# Patient Record
Sex: Male | Born: 1965 | Race: Black or African American | Hispanic: No | Marital: Single | Smoking: Current some day smoker
Health system: Southern US, Community
[De-identification: ages and names within clinical notes are randomized; demographics above are authoritative.]

## PROBLEM LIST (undated history)

## (undated) DIAGNOSIS — Z789 Other specified health status: Secondary | ICD-10-CM

## (undated) DIAGNOSIS — M1990 Unspecified osteoarthritis, unspecified site: Secondary | ICD-10-CM

## (undated) HISTORY — PX: EYE SURGERY: SHX253

---

## 2004-02-02 HISTORY — PX: LASIK: SHX215

## 2011-08-23 ENCOUNTER — Encounter (HOSPITAL_COMMUNITY): Payer: Self-pay | Admitting: *Deleted

## 2011-08-23 ENCOUNTER — Emergency Department (HOSPITAL_COMMUNITY)
Admission: EM | Admit: 2011-08-23 | Discharge: 2011-08-23 | Disposition: A | Discharge status: Home / Self Care | Payer: Medicaid Other | Attending: Emergency Medicine | Admitting: Emergency Medicine

## 2011-08-23 DIAGNOSIS — L02811 Cutaneous abscess of head [any part, except face]: Secondary | ICD-10-CM

## 2011-08-23 DIAGNOSIS — L02818 Cutaneous abscess of other sites: Secondary | ICD-10-CM | POA: Insufficient documentation

## 2011-08-23 MED ORDER — SULFAMETHOXAZOLE-TRIMETHOPRIM 800-160 MG PO TABS: 1.0000 | ORAL_TABLET | Freq: Two times a day (BID) | ORAL | Status: AC

## 2011-08-23 MED ORDER — HYDROCODONE-ACETAMINOPHEN 5-500 MG PO TABS: 1.0000 | ORAL_TABLET | Freq: Four times a day (QID) | ORAL | Status: AC | PRN

## 2011-08-23 NOTE — ED Provider Notes (Signed)
History   This chart was scribed for Geoffery Lyons, MD by Toya Smothers. The patient was seen in room TR08C/TR08C. Patient's care was started at 1422.  CSN: [id number]  Arrival date & time 08/23/11  1422   First MD Initiated Contact with Patient 08/23/11 1722      Chief Complaint  Patient presents with  . Abscess   The history is provided by the patient. No language interpreter was used.   Bruce Bates is a 46 y.o. male who presents to the Emergency Department complaining of sudden onset moderate abscess onset 4 days ago with associate swelling, redness, and white drainage. Pt reports that he believes that the abscess began as an insect bite, though he is unsure. Pt denies fever, chills, emesis, nausea, rash, and cough. Pt is a current everyday smoker, listing no pertinent medical history.   History reviewed. No pertinent past medical history.  History reviewed. No pertinent past surgical history.  History reviewed. No pertinent family history.  History  Substance Use Topics  . Smoking status: Current Some Day Smoker  . Smokeless tobacco: Not on file  . Alcohol Use: No   Review of Systems  Constitutional: Negative for fever and chills.  HENT: Negative for rhinorrhea and neck pain.   Eyes: Negative for pain.  Respiratory: Negative for cough and shortness of breath.   Cardiovascular: Negative for chest pain.  Gastrointestinal: Negative for nausea, vomiting, abdominal pain and diarrhea.  Genitourinary: Negative for dysuria.  Musculoskeletal: Negative for back pain.  Skin: Negative for rash.       Abscess  Neurological: Negative for dizziness and weakness.  All other systems reviewed and are negative.   Allergies  Review of patient's allergies indicates no known allergies.  Home Medications  No current outpatient prescriptions on file.  BP 117/81  Pulse 81  Temp 99 F (37.2 C) (Oral)  Resp 16  SpO2 100%  Physical Exam  Nursing note and vitals  reviewed. Constitutional: He is oriented to person, place, and time. He appears well-developed and well-nourished. No distress.  HENT:  Head: Normocephalic and atraumatic.  Eyes: EOM are normal. Pupils are equal, round, and reactive to light.  Neck: Neck supple. No tracheal deviation present.  Cardiovascular: Normal rate.   Pulmonary/Chest: Effort normal. No respiratory distress.  Abdominal: Soft. He exhibits no distension.  Musculoskeletal: Normal range of motion. He exhibits no edema.  Neurological: He is alert and oriented to person, place, and time. No sensory deficit.  Skin: Skin is warm and dry.       R scalp, there is a 2cm round fluctuant lesion that is draining purulent material. Tender to palpation w/ surrounding erythema.  Psychiatric: He has a normal mood and affect. His behavior is normal.   ED Course  Procedures (including critical care time) DIAGNOSTIC STUDIES: Oxygen Saturation is 100% on room air, normal by my interpretation.    COORDINATION OF CARE: 1726- Evaluated Pt. Pt is without distress. Discussed bactrim antibiotic.  Labs Reviewed - No data to display No results found.  No diagnosis found.  INCISION AND DRAINAGE Performed by: Geoffery Lyons Consent: Verbal consent obtained. Risks and benefits: risks, benefits and alternatives were discussed Type: abscess  Body area: scalp  Anesthesia: local infiltration  Local anesthetic: lidocaine 2% with epinephrine  Anesthetic total: 1 ml  Complexity: complex Blunt dissection to break up loculations  Drainage: purulent  Drainage amount: mild  Packing material: 1/4 in iodoform gauze  Patient tolerance: Patient tolerated the procedure well with no  immediate complications.     MDM  The patient has a scalp abscess that was already draining when he presented.   I was able to express pus just with squeezing it.  I did open it there rest of the way but did not pack it.  He will be discharged to home with  bactrim, pain meds.  To return prn if worsens.      I personally performed the services described in this documentation, which was scribed in my presence. The recorded information has been reviewed and considered.      Geoffery Lyons, MD 08/24/11 [phone]

## 2011-08-23 NOTE — ED Notes (Signed)
Pt reports abscess with drainage above right temporal area. Swollen area noted, bandaid in place.

## 2011-11-03 ENCOUNTER — Emergency Department (HOSPITAL_COMMUNITY)
Admission: EM | Admit: 2011-11-03 | Discharge: 2011-11-03 | Disposition: A | Discharge status: Home / Self Care | Payer: Medicaid Other | Attending: Emergency Medicine | Admitting: Emergency Medicine

## 2011-11-03 ENCOUNTER — Encounter (HOSPITAL_COMMUNITY): Payer: Self-pay | Admitting: Neurology

## 2011-11-03 DIAGNOSIS — IMO0002 Reserved for concepts with insufficient information to code with codable children: Secondary | ICD-10-CM | POA: Insufficient documentation

## 2011-11-03 DIAGNOSIS — L0291 Cutaneous abscess, unspecified: Secondary | ICD-10-CM

## 2011-11-03 DIAGNOSIS — F17200 Nicotine dependence, unspecified, uncomplicated: Secondary | ICD-10-CM | POA: Insufficient documentation

## 2011-11-03 MED ORDER — TRAMADOL HCL 50 MG PO TABS: 50.0000 mg | ORAL_TABLET | Freq: Four times a day (QID) | ORAL | Status: DC | PRN

## 2011-11-03 MED ORDER — SULFAMETHOXAZOLE-TRIMETHOPRIM 800-160 MG PO TABS: 2.0000 | ORAL_TABLET | Freq: Two times a day (BID) | ORAL | Status: DC

## 2011-11-03 NOTE — ED Notes (Signed)
ED PA at bedside to I&D abscess.

## 2011-11-03 NOTE — ED Notes (Signed)
Pt has abscess to left axilla. No drainage noted. Pt reporting hx of such. 9/10 pain. Pt a x 4. NAD

## 2011-11-03 NOTE — ED Provider Notes (Signed)
History     CSN: [id number]  Arrival date & time 11/03/11  1023   First MD Initiated Contact with Patient 11/03/11 1127      Chief Complaint  Patient presents with  . Abscess    (Consider location/radiation/quality/duration/timing/severity/associated sxs/prior treatment) HPI  46 y.o. -year-old male in no acute distress complaining of abscess to left axilla worsening over the course of 72 hours. Patient denies any fever, nausea vomiting. Pain is 9/10 and exacerbated by movement. Patient had similar prior episode.  History reviewed. No pertinent past medical history.  History reviewed. No pertinent past surgical history.  No family history on file.  History  Substance Use Topics  . Smoking status: Current Some Day Smoker  . Smokeless tobacco: Not on file  . Alcohol Use: No      Review of Systems  Constitutional: Negative for fever.  Respiratory: Negative for shortness of breath.   Cardiovascular: Negative for chest pain.  Gastrointestinal: Negative for nausea, vomiting, abdominal pain and diarrhea.  Skin: Positive for rash.  All other systems reviewed and are negative.    Allergies  Review of patient's allergies indicates no known allergies.  Home Medications  No current outpatient prescriptions on file.  BP 109/69  Pulse 63  Temp 97.3 F (36.3 C) (Oral)  Resp 22  SpO2 100%  Physical Exam  Nursing note and vitals reviewed. Constitutional: He is oriented to person, place, and time. He appears well-developed and well-nourished. No distress.  HENT:  Head: Normocephalic.  Eyes: Conjunctivae normal and EOM are normal.  Cardiovascular: Normal rate.   Pulmonary/Chest: Effort normal. No stridor.  Abdominal: Soft.  Musculoskeletal: Normal range of motion.  Neurological: He is alert and oriented to person, place, and time.  Skin:       2 cm fluctuant abscess to left axilla surrounded by approximately 1 cm of indurated skin. No active drainage significant  tenderness to palpation.  Psychiatric: He has a normal mood and affect.    ED Course  Procedures (including critical care time)  INCISION AND DRAINAGE Performed by: Wynetta Emery Consent: Verbal consent obtained. Risks and benefits: risks, benefits and alternatives were discussed Type: abscess  Body area: Left axilla  Anesthesia: local infiltration  Local anesthetic: lidocaine 2% without epinephrine  Anesthetic total: 6 ml  Complexity: complex Blunt dissection to break up loculations  Drainage: purulent  Drainage amount: 3   Packing material: 1/4 in iodoform gauze  Patient tolerance: Patient tolerated the procedure well with no immediate complications.    Labs Reviewed - No data to display No results found.   1. Abscess and cellulitis       MDM  Abscess left axilla status post I&D wound culture sent, which patient instructed to return in 48 hours for wound check. Patient will be started on Bactrim and tramadol for pain control.    New Prescriptions   SULFAMETHOXAZOLE-TRIMETHOPRIM (BACTRIM DS) 800-160 MG PER TABLET    Take 2 tablets by mouth 2 (two) times daily.   TRAMADOL (ULTRAM) 50 MG TABLET    Take 1 tablet (50 mg total) by mouth every 6 (six) hours as needed for pain.          Wynetta Emery, PA-C 11/03/11 1256

## 2011-11-03 NOTE — ED Provider Notes (Signed)
Abscess L axilla.  Move to CDU for I&D  Nelia Shi, MD 11/03/11 1128

## 2011-11-03 NOTE — ED Provider Notes (Signed)
Medical screening examination/treatment/procedure(s) were performed by non-physician practitioner and as supervising physician I was immediately available for consultation/collaboration.    Leng Montesdeoca L Herbert Aguinaldo, MD 11/03/11 2232 

## 2011-11-03 NOTE — ED Notes (Signed)
Pt moved to CDU per EDP.

## 2011-11-06 LAB — CULTURE, ROUTINE-ABSCESS

## 2011-11-06 NOTE — ED Notes (Signed)
Lab called + MRSA abscess culture. Treated with Bactrim, sensitive to same per protocol MD. Will contact patient with positive result.

## 2011-11-07 ENCOUNTER — Telehealth (HOSPITAL_COMMUNITY): Payer: Self-pay | Admitting: Emergency Medicine

## 2012-05-08 ENCOUNTER — Emergency Department (HOSPITAL_COMMUNITY)
Admission: EM | Admit: 2012-05-08 | Discharge: 2012-05-08 | Disposition: A | Discharge status: Home / Self Care | Payer: Medicaid Other | Source: Home / Self Care | Attending: Family Medicine | Admitting: Family Medicine

## 2012-05-08 ENCOUNTER — Encounter (HOSPITAL_COMMUNITY): Payer: Self-pay | Admitting: *Deleted

## 2012-05-08 DIAGNOSIS — T1590XA Foreign body on external eye, part unspecified, unspecified eye, initial encounter: Secondary | ICD-10-CM

## 2012-05-08 DIAGNOSIS — T1591XA Foreign body on external eye, part unspecified, right eye, initial encounter: Secondary | ICD-10-CM

## 2012-05-08 MED ORDER — TOBRAMYCIN 0.3 % OP OINT
TOPICAL_OINTMENT | Freq: Three times a day (TID) | OPHTHALMIC | Status: DC
  Administered 2012-05-08: 20:00:00 via OPHTHALMIC

## 2012-05-08 MED ORDER — TOBRAMYCIN 0.3 % OP OINT
TOPICAL_OINTMENT | OPHTHALMIC | Status: AC
  Filled 2012-05-08: qty 3.5

## 2012-05-08 NOTE — ED Notes (Signed)
C/o R eye pain onset yesterday  He thinks he got something in his eye. He has tried flushing it with water.  No contact use.

## 2012-05-08 NOTE — ED Provider Notes (Signed)
History     CSN: [id number]  Arrival date & time 05/08/12  1478   First MD Initiated Contact with Patient 05/08/12 1824      Chief Complaint  Patient presents with  . Eye Pain    (Consider location/radiation/quality/duration/timing/severity/associated sxs/prior treatment) Patient is a 47 y.o. male presenting with eye pain. The history is provided by the patient.  Eye Pain This is a new problem. The current episode started yesterday. The problem occurs constantly. The problem has not changed since onset.Exacerbated by: blinking.    History reviewed. No pertinent past medical history.  Past Surgical History  Procedure Laterality Date  . Lasik  '06    Family History  Problem Relation Age of Onset  . Cancer Mother     History  Substance Use Topics  . Smoking status: Current Some Day Smoker -- 0.50 packs/day    Types: Cigarettes  . Smokeless tobacco: Not on file  . Alcohol Use: No      Review of Systems  Constitutional: Negative.   HENT: Negative.   Eyes: Positive for pain. Negative for photophobia, discharge, redness, itching and visual disturbance.    Allergies  Review of patient's allergies indicates no known allergies.  Home Medications   Current Outpatient Rx  Name  Route  Sig  Dispense  Refill  . sulfamethoxazole-trimethoprim (BACTRIM DS) 800-160 MG per tablet   Oral   Take 2 tablets by mouth 2 (two) times daily.   28 tablet   0   . traMADol (ULTRAM) 50 MG tablet   Oral   Take 1 tablet (50 mg total) by mouth every 6 (six) hours as needed for pain.   15 tablet   0     BP 128/79  Pulse 60  Temp(Src) 98.4 F (36.9 C) (Oral)  Resp 18  SpO2 99%  Physical Exam  Nursing note and vitals reviewed. Constitutional: He appears well-developed and well-nourished.  HENT:  Head: Normocephalic.  Right Ear: External ear normal.  Left Ear: External ear normal.  Mouth/Throat: Oropharynx is clear and moist.  Eyes: Conjunctivae and EOM are normal. Pupils  are equal, round, and reactive to light. Right eye exhibits no chemosis, no discharge, no exudate and no hordeolum. Foreign body present in the right eye. Left eye exhibits no discharge.      ED Course  Procedures (including critical care time)  Labs Reviewed - No data to display No results found.   1. Foreign body, eye, right, initial encounter       MDM          Linna Hoff, MD 05/08/12 [phone]

## 2012-06-01 ENCOUNTER — Encounter (HOSPITAL_COMMUNITY): Payer: Self-pay | Admitting: *Deleted

## 2012-06-01 ENCOUNTER — Emergency Department (HOSPITAL_COMMUNITY): Payer: Medicaid Other

## 2012-06-01 ENCOUNTER — Emergency Department (HOSPITAL_COMMUNITY)
Admission: EM | Admit: 2012-06-01 | Discharge: 2012-06-01 | Disposition: A | Discharge status: Home / Self Care | Payer: Medicaid Other | Attending: Emergency Medicine | Admitting: Emergency Medicine

## 2012-06-01 DIAGNOSIS — M19011 Primary osteoarthritis, right shoulder: Secondary | ICD-10-CM

## 2012-06-01 DIAGNOSIS — F17200 Nicotine dependence, unspecified, uncomplicated: Secondary | ICD-10-CM | POA: Insufficient documentation

## 2012-06-01 DIAGNOSIS — M25519 Pain in unspecified shoulder: Secondary | ICD-10-CM | POA: Insufficient documentation

## 2012-06-01 DIAGNOSIS — M19019 Primary osteoarthritis, unspecified shoulder: Secondary | ICD-10-CM | POA: Insufficient documentation

## 2012-06-01 DIAGNOSIS — M25511 Pain in right shoulder: Secondary | ICD-10-CM

## 2012-06-01 MED ORDER — CYCLOBENZAPRINE HCL 10 MG PO TABS: 10.0000 mg | ORAL_TABLET | Freq: Two times a day (BID) | ORAL | Status: DC | PRN

## 2012-06-01 MED ORDER — HYDROCODONE-ACETAMINOPHEN 5-325 MG PO TABS
1.0000 | ORAL_TABLET | Freq: Once | ORAL | Status: AC
  Administered 2012-06-01: 1 via ORAL
  Filled 2012-06-01: qty 1

## 2012-06-01 MED ORDER — NAPROXEN 500 MG PO TABS: 500.0000 mg | ORAL_TABLET | Freq: Two times a day (BID) | ORAL | Status: DC

## 2012-06-01 NOTE — ED Notes (Signed)
Pt reports right shoulder pain since April 19th. C/o swelling and pain not relieved by ice packs and taking tylenol. Denies injury

## 2012-06-01 NOTE — ED Notes (Signed)
Patient transported to X-ray 

## 2012-06-01 NOTE — ED Provider Notes (Signed)
History  This chart was scribed for non-physician practitioner Dierdre Forth, PA-C working with Glynn Octave, MD, by Candelaria Stagers, ED Scribe. This patient was seen in room WTR6/WTR6 and the patient's care was started at 8:10 PM   CSN: [id number]  Arrival date & time 06/01/12  2001   First MD Initiated Contact with Patient 06/01/12 2009      Chief Complaint  Patient presents with  . Shoulder Pain     The history is provided by the patient. No language interpreter was used.   Lonzo Saulter is a 47 y.o. male who presents to the Emergency Department complaining of constant right shoulder pain that started about one month ago with no changes.  He denies injury or trauma.  Pt reports the pain is worse at night.  He has taken Motrin with no relief. He states she has also tried ice packs and tylenol without relief.   Nothing makes it particularly better or worse.  The pain is described as aching, rated at an 8/10, pinpoint over the [facility] joint and non-radiating.  Pt is without Hx of trauma in the distant past.    History reviewed. No pertinent past medical history.  Past Surgical History  Procedure Laterality Date  . Lasik  '06    Family History  Problem Relation Age of Onset  . Cancer Mother     History  Substance Use Topics  . Smoking status: Current Some Day Smoker -- 0.50 packs/day    Types: Cigarettes  . Smokeless tobacco: Not on file  . Alcohol Use: No      Review of Systems  Musculoskeletal: Positive for arthralgias (right shoulder pain ).  All other systems reviewed and are negative.    Allergies  Review of patient's allergies indicates no known allergies.  Home Medications   Current Outpatient Rx  Name  Route  Sig  Dispense  Refill  . acetaminophen (TYLENOL) 325 MG tablet   Oral   Take 650 mg by mouth every 6 (six) hours as needed for pain.         Marland Kitchen ibuprofen (ADVIL,MOTRIN) 200 MG tablet   Oral   Take 200 mg by mouth every 6 (six) hours as  needed for pain.         . cyclobenzaprine (FLEXERIL) 10 MG tablet   Oral   Take 1 tablet (10 mg total) by mouth 2 (two) times daily as needed for muscle spasms.   20 tablet   0   . naproxen (NAPROSYN) 500 MG tablet   Oral   Take 1 tablet (500 mg total) by mouth 2 (two) times daily with a meal.   30 tablet   0     BP 119/75  Pulse 91  Temp(Src) 99 F (37.2 C) (Oral)  SpO2 99%  Physical Exam  Nursing note and vitals reviewed. Constitutional: He appears well-developed and well-nourished. No distress.  HENT:  Head: Normocephalic and atraumatic.  Eyes: Conjunctivae are normal.  Neck: Normal range of motion and full passive range of motion without pain. No spinous process tenderness and no muscular tenderness present. No rigidity. Normal range of motion present.  Cardiovascular: Normal rate, regular rhythm, normal heart sounds and intact distal pulses.   No murmur heard. Capillary refill less than 3 seconds.   Pulmonary/Chest: Effort normal and breath sounds normal. No respiratory distress. He has no wheezes.  Musculoskeletal: Normal range of motion. He exhibits tenderness. He exhibits no edema.       Right shoulder:  He exhibits tenderness and bony tenderness. He exhibits normal range of motion, no swelling, no effusion, no crepitus, no deformity, no laceration, no pain, no spasm, normal pulse and normal strength.  Full ROM of right shoulder.  No joint line tenderness. Mild tenderness to palpation of right trapezius. Mild pinpoint pain over the [facility] joint Negative empty can test and apprehension test  Lymphadenopathy:    He has no cervical adenopathy.  Neurological: He is alert. He exhibits normal muscle tone. Coordination normal.  Sensation intact.  Strength intact.    Skin: Skin is warm and dry. No rash noted. He is not diaphoretic. No erythema.  No tenting of the skin  Psychiatric: He has a normal mood and affect.    ED Course  Procedures   DIAGNOSTIC STUDIES: Oxygen  Saturation is 99% on room air, normal by my interpretation.    COORDINATION OF CARE:  8:10 PM Discussed course of care with pt which includes right shoulder xray, antiinflammatory, and muscle relaxer.  Advised pt to follow up with orthopaedist.  Pt understands and agrees.    Labs Reviewed - No data to display Dg Shoulder Right  06/01/2012  *RADIOLOGY REPORT*  Clinical Data: Right shoulder pain since 04/19. No Known injury.  RIGHT SHOULDER - 2+ VIEW  Comparison:  None.  Findings:  There is no evidence of fracture or dislocation. Mild degenerative change AC joint without separation.  No osseous destructive lesion.  Adjacent ribs intact.  IMPRESSION: No evidence for fracture or dislocation.  Mild DJD AC joint.   Original Report Authenticated By: Davonna Belling, M.D.      1. Arthralgia of right acromioclavicular joint   2. Arthritis of right acromioclavicular joint       MDM  Oletta Darter presents with ongoing nontraumatic L shoulder pain >31month.  Patient X-Ray negative for obvious fracture or dislocation. I personally reviewed the imaging tests through PACS system.  I reviewed available ER/hospitalization records through the EMR.  Pain managed in ED. Pt advised to follow up with orthopedics if symptoms persist for possibility of missed fracture diagnosis. Patient given sling while in ED, conservative therapy recommended and discussed. Patient will be dc home & is agreeable with above plan.  I have also discussed reasons to return immediately to the ER.  Patient expresses understanding and agrees with plan.   I personally performed the services described in this documentation, which was scribed in my presence. The recorded information has been reviewed and is accurate.   Dahlia Client Carlita Whitcomb, PA-C 06/02/12 [phone]

## 2012-06-02 NOTE — ED Provider Notes (Signed)
Medical screening examination/treatment/procedure(s) were performed by non-physician practitioner and as supervising physician I was immediately available for consultation/collaboration.  Glynn Octave, MD 06/02/12 [phone]

## 2013-11-18 ENCOUNTER — Encounter (HOSPITAL_COMMUNITY): Payer: Self-pay | Admitting: Emergency Medicine

## 2013-11-18 ENCOUNTER — Emergency Department (HOSPITAL_COMMUNITY)
Admission: EM | Admit: 2013-11-18 | Discharge: 2013-11-18 | Disposition: A | Discharge status: Home / Self Care | Payer: Medicaid Other | Attending: Emergency Medicine | Admitting: Emergency Medicine

## 2013-11-18 DIAGNOSIS — M545 Low back pain, unspecified: Secondary | ICD-10-CM

## 2013-11-18 DIAGNOSIS — Z720 Tobacco use: Secondary | ICD-10-CM | POA: Insufficient documentation

## 2013-11-18 DIAGNOSIS — M549 Dorsalgia, unspecified: Secondary | ICD-10-CM | POA: Diagnosis present

## 2013-11-18 DIAGNOSIS — M791 Myalgia: Secondary | ICD-10-CM | POA: Insufficient documentation

## 2013-11-18 MED ORDER — NAPROXEN 500 MG PO TABS: 500.0000 mg | ORAL_TABLET | Freq: Two times a day (BID) | ORAL | Status: DC

## 2013-11-18 MED ORDER — METHOCARBAMOL 500 MG PO TABS: 500.0000 mg | ORAL_TABLET | Freq: Two times a day (BID) | ORAL | Status: DC | PRN

## 2013-11-18 NOTE — ED Notes (Signed)
Pt reports lower back pain since last Tuesday, pt was cutting grass with self Systems developerpropeller lawn mower. Denies trauma or injury. Pain 10/10.

## 2013-11-18 NOTE — ED Notes (Signed)
Pt ambulatory upon dc with ALL belongings they arrived with.

## 2013-11-18 NOTE — ED Provider Notes (Signed)
CSN: [id number]     Arrival date & time 11/18/13  1503 History   None    Chief Complaint  Patient presents with  . Back Pain   Patient is a 48 y.o. male presenting with back pain. The history is provided by the patient. No language interpreter was used.  Back Pain  This chart was scribed for non-physician practitioner, Mayme GentaBen Jaquise Faux PA-C working with Gilda Creasehristopher J. Pollina, *, by Bruce Bates, ED Scribe. This patient was seen in room WTR8/WTR8 and the patient's care was started at 4:08 PM.  Bruce Bates is a 48 y.o. male who presents to the Emergency Department complaining of left lower back pain that began 5 days ago. Pt states he was laying around the house when back pain began. He reports hx injuring back in 2006 and has had intermittent back pain since. Pt described pain as a tightness with standing. Pt has tried tylenol without relief to pain. Pt has not been able to sleep due to the pain. He reports unsteady gait. Pt denies new or recent injury. Pt denies hx back pain red flags. Pt denies bladder and bowel incontinence. He denies PCP and orthopedist.  History reviewed. No pertinent past medical history. Past Surgical History  Procedure Laterality Date  . Lasik  '06   Family History  Problem Relation Age of Onset  . Cancer Mother    History  Substance Use Topics  . Smoking status: Current Some Day Smoker -- 0.50 packs/day for 25 years    Types: Cigarettes  . Smokeless tobacco: Not on file  . Alcohol Use: No   Review of Systems  Musculoskeletal: Positive for back pain, gait problem and myalgias.  All other systems reviewed and are negative.  Allergies  Review of patient's allergies indicates no known allergies.  Home Medications   Prior to Admission medications   Medication Sig Start Date End Date Taking? Authorizing Provider  acetaminophen (TYLENOL) 325 MG tablet Take 650 mg by mouth every 6 (six) hours as needed for mild pain or moderate pain (pain).    Yes Historical  Provider, MD  methocarbamol (ROBAXIN) 500 MG tablet Take 1 tablet (500 mg total) by mouth 2 (two) times daily as needed for muscle spasms. 11/18/13   Earle GellBenjamin W Jaquelin Meaney, PA-C  naproxen (NAPROSYN) 500 MG tablet Take 1 tablet (500 mg total) by mouth 2 (two) times daily. 11/18/13   Earle GellBenjamin W Einar Nolasco, PA-C   BP 106/82  Pulse 61  Temp(Src) 99 F (37.2 C) (Oral)  Resp 16  SpO2 100% Physical Exam  Nursing note and vitals reviewed. Constitutional: He is oriented to person, place, and time. He appears well-developed and well-nourished. No distress.  Awake, alert, nontoxic appearance with baseline speech.  HENT:  Head: Normocephalic and atraumatic.  Eyes: Conjunctivae and EOM are normal. Pupils are equal, round, and reactive to light. Right eye exhibits no discharge. Left eye exhibits no discharge.  Neck: Neck supple.  Cardiovascular: Normal rate and regular rhythm.   No murmur heard. Pulmonary/Chest: Effort normal and breath sounds normal. No respiratory distress. He has no wheezes. He has no rales. He exhibits no tenderness.  Abdominal: Soft. Bowel sounds are normal. He exhibits no mass. There is no tenderness. There is no rebound.  Musculoskeletal: Normal range of motion.  Diffuse paravertebral lumbar muscle tenderness. No midline bony tenderness. No overt erythema, rashes or other deformities appreciated. Bilateral lower extremities non tender without new rashes or color change, baseline ROM with intact DP / PT pulses, CR<2  secs all digits bilaterally, sensation baseline light touch bilaterally for pt, motor symmetric bilateral 5 / 5 hip flexion, quadriceps, hamstrings, EHL, foot dorsiflexion, foot plantarflexion, gait somewhat antalgic but without apparent new ataxia.  Neurological: He is alert and oriented to person, place, and time.  Mental status baseline for patient.  Upper extremity motor strength and sensation intact and symmetric bilaterally.  Skin: Skin is warm and dry. No rash noted.   Psychiatric: He has a normal mood and affect. His behavior is normal.    ED Course  Procedures  DIAGNOSTIC STUDIES: Oxygen Saturation is 100% on RA, normal by my interpretation.    COORDINATION OF CARE: 4:38 PM- Pt advised of plan for treatment which includes a muscle relaxer and antiinflammatory and pt agrees.  Labs Review Labs Reviewed - No data to display  Imaging Review No results found.   EKG Interpretation None      MDM  Vitals stable - WNL -afebrile Pt resting comfortably in ED. PE not concerning for acute or emergent pathology. No mechanism of injury. No back pain red flags. NVI.  Doubt cord pathology, cauda equina, epidural abscess, frx/dislocation, other cellulitis or infx, zoster.   Will DC with NSAIDS and Robaxin for inflammation and symptom relief. Discussed f/u with PCP and return precautions, pt very amenable to plan. Pt stable, in good condition and is appropriate for DC.   Final diagnoses:  Left-sided low back pain without sciatica    I personally performed the services described in this documentation, which was scribed in my presence. The recorded information has been reviewed and is accurate.         Sharlene MottsBenjamin W Jovante Hammitt, PA-C 11/19/13 1231

## 2013-11-21 NOTE — ED Provider Notes (Signed)
Medical screening examination/treatment/procedure(s) were performed by non-physician practitioner and as supervising physician I was immediately available for consultation/collaboration.   EKG Interpretation None        Christopher J. Pollina, MD 11/21/13 1448 

## 2013-12-08 ENCOUNTER — Encounter (HOSPITAL_COMMUNITY): Payer: Self-pay | Admitting: Emergency Medicine

## 2013-12-08 ENCOUNTER — Emergency Department (HOSPITAL_COMMUNITY)
Admission: EM | Admit: 2013-12-08 | Discharge: 2013-12-08 | Disposition: A | Discharge status: Home / Self Care | Payer: Medicaid Other | Attending: Emergency Medicine | Admitting: Emergency Medicine

## 2013-12-08 ENCOUNTER — Emergency Department (HOSPITAL_COMMUNITY): Payer: Medicaid Other

## 2013-12-08 DIAGNOSIS — Z791 Long term (current) use of non-steroidal anti-inflammatories (NSAID): Secondary | ICD-10-CM | POA: Diagnosis not present

## 2013-12-08 DIAGNOSIS — M5417 Radiculopathy, lumbosacral region: Secondary | ICD-10-CM

## 2013-12-08 DIAGNOSIS — M545 Low back pain, unspecified: Secondary | ICD-10-CM

## 2013-12-08 DIAGNOSIS — Z720 Tobacco use: Secondary | ICD-10-CM | POA: Insufficient documentation

## 2013-12-08 MED ORDER — DIAZEPAM 5 MG/ML IJ SOLN
5.0000 mg | Freq: Once | INTRAMUSCULAR | Status: AC
  Administered 2013-12-08: 5 mg via INTRAMUSCULAR
  Filled 2013-12-08: qty 2

## 2013-12-08 MED ORDER — DIAZEPAM 5 MG PO TABS: 5.0000 mg | ORAL_TABLET | Freq: Two times a day (BID) | ORAL | Status: DC

## 2013-12-08 NOTE — ED Notes (Signed)
Pt arrived to the ED with the complaint of back pain.  Pt states he has had back pain for a month.  Pt states that he has a previous back injury.  Pt seen here for same two weeks ago but has been unable to follow up.

## 2013-12-08 NOTE — ED Provider Notes (Signed)
CSN: [id number]     Arrival date & time 12/08/13  0006 History   First MD Initiated Contact with Patient 12/08/13 0347     Chief Complaint  Patient presents with  . Back Pain     (Consider location/radiation/quality/duration/timing/severity/associated sxs/prior Treatment) HPI Comments: Patient is a 48 year old male presenting to the emergency department for continued left-sided low back pain with radiation down his leg since being seen 2 weeks ago. He states that the muscle relaxer and anti-inflammatories given to him has done nothing for his pain. He states the pain has been so severe this causes like to follow out causing him to fall on his bottom. He denies hitting his head or any loss of consciousness. He is not tried any other measures for his pain. He has not followed up with his PCP as advised. Denies any fevers, chills, nausea, vomiting, night sweats, bladder or bowel incontinence, numbness in his extremities, saddle anesthesia.   History reviewed. No pertinent past medical history. Past Surgical History  Procedure Laterality Date  . Lasik  '06   Family History  Problem Relation Age of Onset  . Cancer Mother    History  Substance Use Topics  . Smoking status: Current Some Day Smoker -- 0.50 packs/day for 25 years    Types: Cigarettes  . Smokeless tobacco: Not on file  . Alcohol Use: No    Review of Systems  Musculoskeletal: Positive for back pain.  All other systems reviewed and are negative.     Allergies  Review of patient's allergies indicates no known allergies.  Home Medications   Prior to Admission medications   Medication Sig Start Date End Date Taking? Authorizing Provider  naproxen (NAPROSYN) 500 MG tablet Take 1 tablet (500 mg total) by mouth 2 (two) times daily. 11/18/13  Yes Earle GellBenjamin W Cartner, PA-C  acetaminophen (TYLENOL) 325 MG tablet Take 650 mg by mouth every 6 (six) hours as needed for mild pain or moderate pain (pain).     Historical Provider,  MD  diazepam (VALIUM) 5 MG tablet Take 1 tablet (5 mg total) by mouth 2 (two) times daily. 12/08/13   Avyn Coate L Sherree Shankman, PA-C   BP 109/71 mmHg  Pulse 54  Temp(Src) 97.7 F (36.5 C) (Oral)  Resp 16  SpO2 96% Physical Exam  Constitutional: He is oriented to person, place, and time. He appears well-developed and well-nourished. No distress.  Patient asleep on my entrance into the room.  HENT:  Head: Normocephalic and atraumatic.  Right Ear: External ear normal.  Left Ear: External ear normal.  Nose: Nose normal.  Mouth/Throat: Oropharynx is clear and moist. No oropharyngeal exudate.  Eyes: Conjunctivae and EOM are normal. Pupils are equal, round, and reactive to light.  Neck: Normal range of motion. Neck supple.  Cardiovascular: Normal rate, regular rhythm, normal heart sounds and intact distal pulses.   Pulmonary/Chest: Effort normal and breath sounds normal. No respiratory distress.  Abdominal: Soft. There is no tenderness.  Musculoskeletal: Normal range of motion.       Lumbar back: He exhibits tenderness. He exhibits normal range of motion, no bony tenderness and no deformity.  Neurological: He is alert and oriented to person, place, and time. He has normal strength. No cranial nerve deficit. Gait normal. GCS eye subscore is 4. GCS verbal subscore is 5. GCS motor subscore is 6.  Sensation grossly intact.  No pronator drift.  Bilateral heel-knee-shin intact.  Skin: Skin is warm and dry. He is not diaphoretic.  Psychiatric: He  has a normal mood and affect.  Nursing note and vitals reviewed.   ED Course  Procedures (including critical care time) Medications  diazepam (VALIUM) injection 5 mg (5 mg Intramuscular Given 12/08/13 0435)    Labs Review Labs Reviewed  ETHANOL    Imaging Review Dg Lumbar Spine Complete  12/08/2013   CLINICAL DATA:  Low back pain for 3 weeks.  Multiple falls.  EXAM: LUMBAR SPINE - COMPLETE 4+ VIEW  COMPARISON:  None.  FINDINGS: Four non  rib-bearing lumbar vertebrae consistent with sacralization of L5. There is no evidence of lumbar spine fracture. Alignment is normal. Intervertebral disc spaces are maintained.  IMPRESSION: Negative.   Electronically Signed   By: Burman NievesWilliam  Stevens M.D.   On: 12/08/2013 04:45     EKG Interpretation None      MDM   Final diagnoses:  Low back pain  Radicular pain of lumbosacral region    Filed Vitals:   12/08/13 0403  BP: 109/71  Pulse: 54  Temp: 97.7 F (36.5 C)  Resp: 16   Afebrile, NAD, non-toxic appearing, AAOx4.  I have reviewed nursing notes, vital signs, and all appropriate lab and imaging results for this patient.  Patient with back pain.  No neurological deficits and normal neuro exam.  Patient can walk but states is painful.  No loss of bowel or bladder control.  No concern for cauda equina.  No fever, night sweats, weight loss, h/o cancer, IVDU.  Pain managed in emergency department.RICE protocol and pain medicine indicated and discussed with patient.  Patient is stable at time of discharge     Jeannetta EllisJennifer L Tyreona Panjwani, PA-C 12/08/13 [id number]  Tomasita CrumbleAdeleke Oni, MD 12/08/13 1420

## 2013-12-08 NOTE — Discharge Instructions (Signed)
Please follow up with your primary care physician in 1-2 days. If you do not have one please call the [facility] number listed above. Please take pain medication and/or muscle relaxants as prescribed and as needed for pain. Please do not drive on narcotic pain medication or on muscle relaxants. Please read all discharge instructions and return precautions.   Radicular Pain Radicular pain in either the arm or leg is usually from a bulging or herniated disk in the spine. A piece of the herniated disk may press against the nerves as the nerves exit the spine. This causes pain which is felt at the tips of the nerves down the arm or leg. Other causes of radicular pain may include:  Fractures.  Heart disease.  Cancer.  An abnormal and usually degenerative state of the nervous system or nerves (neuropathy). Diagnosis may require CT or MRI scanning to determine the primary cause.  Nerves that start at the neck (nerve roots) may cause radicular pain in the outer shoulder and arm. It can spread down to the thumb and fingers. The symptoms vary depending on which nerve root has been affected. In most cases radicular pain improves with conservative treatment. Neck problems may require physical therapy, a neck collar, or cervical traction. Treatment may take many weeks, and surgery may be considered if the symptoms do not improve.  Conservative treatment is also recommended for sciatica. Sciatica causes pain to radiate from the lower back or buttock area down the leg into the foot. Often there is a history of back problems. Most patients with sciatica are better after 2 to 4 weeks of rest and other supportive care. Short term bed rest can reduce the disk pressure considerably. Sitting, however, is not a good position since this increases the pressure on the disk. You should avoid bending, lifting, and all other activities which make the problem worse. Traction can be used in severe cases.  Surgery is usually reserved for patients who do not improve within the first months of treatment. Only take over-the-counter or prescription medicines for pain, discomfort, or fever as directed by your caregiver. Narcotics and muscle relaxants may help by relieving more severe pain and spasm and by providing mild sedation. Cold or massage can give significant relief. Spinal manipulation is not recommended. It can increase the degree of disc protrusion. Epidural steroid injections are often effective treatment for radicular pain. These injections deliver medicine to the spinal nerve in the space between the protective covering of the spinal cord and back bones (vertebrae). Your caregiver can give you more information about steroid injections. These injections are most effective when given within two weeks of the onset of pain.  You should see your caregiver for follow up care as recommended. A program for neck and back injury rehabilitation with stretching and strengthening exercises is an important part of management.  SEEK IMMEDIATE MEDICAL CARE IF:  You develop increased pain, weakness, or numbness in your arm or leg.  You develop difficulty with bladder or bowel control.  You develop abdominal pain. Document Released: 02/26/2004 Document Revised: 04/12/2011 Document Reviewed: 05/13/2008 [facility] Patient Information 2015 [place], [place]. This information is not intended to replace advice given to you by your health care provider. Make sure you discuss any questions you have with your health care provider.  Back Pain, Adult Low back pain is very common. About 1 in 5 people have back pain.The cause of low back pain is rarely dangerous. The pain often gets better  over time.About half of people with a sudden onset of back pain feel better in just 2 weeks. About 8 in 10 people feel better by 6 weeks.  CAUSES Some common causes of back pain include:  Strain of the muscles or ligaments supporting the  spine.  Wear and tear (degeneration) of the spinal discs.  Arthritis.  Direct injury to the back. DIAGNOSIS Most of the time, the direct cause of low back pain is not known.However, back pain can be treated effectively even when the exact cause of the pain is unknown.Answering your caregiver's questions about your overall health and symptoms is one of the most accurate ways to make sure the cause of your pain is not dangerous. If your caregiver needs more information, he or she may order lab work or imaging tests (X-rays or MRIs).However, even if imaging tests show changes in your back, this usually does not require surgery. HOME CARE INSTRUCTIONS For many people, back pain returns.Since low back pain is rarely dangerous, it is often a condition that people can learn to [facility] their own.   Remain active. It is stressful on the back to sit or stand in one place. Do not sit, drive, or stand in one place for more than 30 minutes at a time. Take short walks on level surfaces as soon as pain allows.Try to increase the length of time you walk each day.  Do not stay in bed.Resting more than 1 or 2 days can delay your recovery.  Do not avoid exercise or work.Your body is made to move.It is not dangerous to be active, even though your back may hurt.Your back will likely heal faster if you return to being active before your pain is gone.  Pay attention to your body when you bend and lift. Many people have less discomfortwhen lifting if they bend their knees, keep the load close to their bodies,and avoid twisting. Often, the most comfortable positions are those that put less stress on your recovering back.  Find a comfortable position to sleep. Use a firm mattress and lie on your side with your knees slightly bent. If you lie on your back, put a pillow under your knees.  Only take over-the-counter or prescription medicines as directed by your caregiver. Over-the-counter medicines to reduce  pain and inflammation are often the most helpful.Your caregiver may prescribe muscle relaxant drugs.These medicines help dull your pain so you can more quickly return to your normal activities and healthy exercise.  Put ice on the injured area.  Put ice in a plastic bag.  Place a towel between your skin and the bag.  Leave the ice on for 15-20 minutes, 03-04 times a day for the first 2 to 3 days. After that, ice and heat may be alternated to reduce pain and spasms.  Ask your caregiver about trying back exercises and gentle massage. This may be of some benefit.  Avoid feeling anxious or stressed.Stress increases muscle tension and can worsen back pain.It is important to recognize when you are anxious or stressed and learn ways to manage it.Exercise is a great option. SEEK MEDICAL CARE IF:  You have pain that is not relieved with rest or medicine.  You have pain that does not improve in 1 week.  You have new symptoms.  You are generally not feeling well. SEEK IMMEDIATE MEDICAL CARE IF:   You have pain that radiates from your back into your legs.  You develop new bowel or bladder control problems.  You have  unusual weakness or numbness in your arms or legs.  You develop nausea or vomiting.  You develop abdominal pain.  You feel faint. Document Released: 01/18/2005 Document Revised: 07/20/2011 Document Reviewed: 05/22/2013 [facility] Patient Information 2015 [place], [place]. This information is not intended to replace advice given to you by your health care provider. Make sure you discuss any questions you have with your health care provider.

## 2014-01-08 ENCOUNTER — Encounter: Payer: Self-pay | Admitting: Family Medicine

## 2014-01-08 ENCOUNTER — Ambulatory Visit: Payer: Medicaid Other | Attending: Family Medicine | Admitting: Family Medicine

## 2014-01-08 VITALS — BP 109/73 | HR 68 | Temp 98.7°F | Resp 18 | Ht 72.0 in | Wt 167.0 lb

## 2014-01-08 DIAGNOSIS — M545 Low back pain: Secondary | ICD-10-CM | POA: Insufficient documentation

## 2014-01-08 DIAGNOSIS — R634 Abnormal weight loss: Secondary | ICD-10-CM

## 2014-01-08 DIAGNOSIS — G8929 Other chronic pain: Secondary | ICD-10-CM

## 2014-01-08 DIAGNOSIS — M5410 Radiculopathy, site unspecified: Secondary | ICD-10-CM

## 2014-01-08 DIAGNOSIS — E538 Deficiency of other specified B group vitamins: Secondary | ICD-10-CM

## 2014-01-08 DIAGNOSIS — R7989 Other specified abnormal findings of blood chemistry: Secondary | ICD-10-CM

## 2014-01-08 DIAGNOSIS — M5416 Radiculopathy, lumbar region: Secondary | ICD-10-CM

## 2014-01-08 MED ORDER — CYCLOBENZAPRINE HCL 10 MG PO TABS: 10.0000 mg | ORAL_TABLET | Freq: Two times a day (BID) | ORAL | Status: DC | PRN

## 2014-01-08 MED ORDER — GABAPENTIN 300 MG PO CAPS: 300.0000 mg | ORAL_CAPSULE | Freq: Three times a day (TID) | ORAL | Status: DC

## 2014-01-08 MED ORDER — CANE MISC: 1.0000 | Freq: Every day | Status: DC
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
## 2014-01-08 NOTE — Assessment & Plan Note (Addendum)
A: weight loss in the setting of chronic low back pain and smoking. Admits to night sweats. P: HIV TSH CXR Assess mood  CMP, CBC with diff

## 2014-01-08 NOTE — Patient Instructions (Signed)
Mr. Janeece AgeeHiggins,  Thank you for coming in today. It was a pleasure meeting you. I look forward to being your primary doctor.  1. Low back pain with sciatica on the left side:  Start gabapentin 300 mg at night for 3 days, then twice daily for 3 days, then once daily.  Flexeril nightly as needed. MRI of lumbar spine. PT to work on strength and fall prevention. I recommend that you get a cane and use it in your R hand.    2. Weight loss: we will evaluate starting with labs, [facility].   F/u in 3-4 weeks  Dr. Armen PickupFunches

## 2014-01-08 NOTE — Assessment & Plan Note (Addendum)
A: chronic low back pain with sciatica, worsening symptoms  P: B12 level  MRI lumbar spine  PT Gabapentin Muscle relaxer prn
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
## 2014-01-09 DIAGNOSIS — E538 Deficiency of other specified B group vitamins: Secondary | ICD-10-CM | POA: Insufficient documentation

## 2014-01-09 LAB — CBC WITH DIFFERENTIAL/PLATELET
BASOS PCT: 0 % (ref 0–1)
Basophils Absolute: 0 10*3/uL (ref 0.0–0.1)
EOS ABS: 0.1 10*3/uL (ref 0.0–0.7)
Eosinophils Relative: 1 % (ref 0–5)
HEMATOCRIT: 41.3 % (ref 39.0–52.0)
HEMOGLOBIN: 13.9 g/dL (ref 13.0–17.0)
LYMPHS ABS: 2 10*3/uL (ref 0.7–4.0)
Lymphocytes Relative: 40 % (ref 12–46)
MCH: 28.6 pg (ref 26.0–34.0)
MCHC: 33.7 g/dL (ref 30.0–36.0)
MCV: 85 fL (ref 78.0–100.0)
MONO ABS: 0.4 10*3/uL (ref 0.1–1.0)
MONOS PCT: 8 % (ref 3–12)
MPV: 10.8 fL (ref 9.4–12.4)
NEUTROS PCT: 51 % (ref 43–77)
Neutro Abs: 2.6 10*3/uL (ref 1.7–7.7)
Platelets: 236 10*3/uL (ref 150–400)
RBC: 4.86 MIL/uL (ref 4.22–5.81)
RDW: 14.8 % (ref 11.5–15.5)
WBC: 5.1 10*3/uL (ref 4.0–10.5)

## 2014-01-09 LAB — COMPLETE METABOLIC PANEL WITH GFR
ALBUMIN: 4.7 g/dL (ref 3.5–5.2)
ALK PHOS: 46 U/L (ref 39–117)
ALT: 15 U/L (ref 0–53)
AST: 25 U/L (ref 0–37)
BILIRUBIN TOTAL: 0.6 mg/dL (ref 0.2–1.2)
BUN: 14 mg/dL (ref 6–23)
CO2: 24 mEq/L (ref 19–32)
Calcium: 9.6 mg/dL (ref 8.4–10.5)
Chloride: 101 mEq/L (ref 96–112)
Creat: 0.99 mg/dL (ref 0.50–1.35)
GFR, Est African American: >89 mL/min
GFR, Est Non African American: >89 mL/min
GLUCOSE: 75 mg/dL (ref 70–99)
POTASSIUM: 4.3 meq/L (ref 3.5–5.3)
SODIUM: 138 meq/L (ref 135–145)
TOTAL PROTEIN: 7.4 g/dL (ref 6.0–8.3)

## 2014-01-09 LAB — HIV ANTIBODY (ROUTINE TESTING W REFLEX): HIV: NONREACTIVE

## 2014-01-09 LAB — VITAMIN B12: VITAMIN B 12: 202 pg/mL — AB (ref 211–911)

## 2014-01-09 LAB — TSH: TSH: 0.656 u[IU]/mL (ref 0.350–4.500)

## 2014-01-09 MED ORDER — CYANOCOBALAMIN 500 MCG PO TABS: 1000.0000 ug | ORAL_TABLET | Freq: Every day | ORAL | Status: DC

## 2014-01-09 NOTE — Assessment & Plan Note (Signed)
Vit B12 is borderline low and could be contributing to some of the neurological symptoms. Plan for f/u labs (MMA and homocysteine levels). Plan for treatment with oral vit b12 1000 mg daily sent to pharmacy.

## 2014-01-09 NOTE — Addendum Note (Signed)
Addended by: Dessa PhiFUNCHES, Amire Leazer on: 01/09/2014 09:06 AM   Modules accepted: Orders

## 2014-01-11 ENCOUNTER — Other Ambulatory Visit: Payer: Self-pay | Admitting: Family Medicine

## 2014-01-11 ENCOUNTER — Telehealth: Payer: Self-pay | Admitting: Family Medicine

## 2014-01-11 ENCOUNTER — Ambulatory Visit (HOSPITAL_COMMUNITY)
Admission: RE | Admit: 2014-01-11 | Discharge: 2014-01-11 | Disposition: A | Discharge status: Home / Self Care | Payer: Medicaid Other | Source: Ambulatory Visit | Attending: Family Medicine | Admitting: Family Medicine

## 2014-01-11 DIAGNOSIS — M5126 Other intervertebral disc displacement, lumbar region: Secondary | ICD-10-CM

## 2014-01-11 DIAGNOSIS — G8929 Other chronic pain: Secondary | ICD-10-CM

## 2014-01-11 DIAGNOSIS — M5416 Radiculopathy, lumbar region: Principal | ICD-10-CM

## 2014-01-11 DIAGNOSIS — M5116 Intervertebral disc disorders with radiculopathy, lumbar region: Secondary | ICD-10-CM | POA: Insufficient documentation

## 2014-01-11 DIAGNOSIS — R05 Cough: Secondary | ICD-10-CM | POA: Diagnosis not present

## 2014-01-11 DIAGNOSIS — Q7649 Other congenital malformations of spine, not associated with scoliosis: Secondary | ICD-10-CM | POA: Diagnosis not present

## 2014-01-11 DIAGNOSIS — R634 Abnormal weight loss: Secondary | ICD-10-CM | POA: Diagnosis present

## 2014-01-11 DIAGNOSIS — R61 Generalized hyperhidrosis: Secondary | ICD-10-CM | POA: Diagnosis not present

## 2014-01-11 MED ORDER — NAPROXEN 500 MG PO TABS: 500.0000 mg | ORAL_TABLET | Freq: Two times a day (BID) | ORAL | Status: DC

## 2014-01-11 NOTE — Telephone Encounter (Signed)
Patient has come in today to inform PCP of a medication Reax he is having towards gabapentin (NEURONTIN) 300 MG capsule and cyclobenzaprine (FLEXERIL) 10 MG tablet; Patient is experiencing confusion, swelling in legs,vomitting and irritable; [facility] advised patient to discontinue medication

## 2014-01-11 NOTE — Assessment & Plan Note (Signed)
Herniated disk in lumbar area consistent with symptoms Referral to neurosurgery placed

## 2014-01-11 NOTE — Assessment & Plan Note (Signed)
Herniated disk in lumbar area consistent with symptoms Referral to neurosurgery placed  

## 2014-01-14 NOTE — Telephone Encounter (Signed)
Pt stated medication do not work want Rx Vicodin and Xanax. Pt was notified we don not Rx Narcotics. Will Refer  to pain managment

## 2014-01-15 ENCOUNTER — Ambulatory Visit: Payer: Medicaid Other | Attending: Family Medicine

## 2014-01-15 DIAGNOSIS — M5432 Sciatica, left side: Secondary | ICD-10-CM | POA: Insufficient documentation

## 2014-01-22 ENCOUNTER — Telehealth: Payer: Self-pay | Admitting: *Deleted

## 2014-01-22 NOTE — Telephone Encounter (Signed)
-----   Message from Lora PaulaJosalyn C Funches, MD sent at 01/11/2014  2:36 PM EST ----- Herniated disk in low back source of pain, referral to neurosurgery placed. Continue current medication regimen with addition of naproxen BID with food.  Pt referral already in.

## 2014-01-22 NOTE — Telephone Encounter (Signed)
Notified has Rx at the pharmacy, stated will pick up today Aware of Neurosurgery appointment. Aware of lab results

## 2014-01-22 NOTE — Telephone Encounter (Signed)
-----   Message from Lora PaulaJosalyn C Funches, MD sent at 01/11/2014  1:47 PM EST ----- Normal chest x-ray

## 2014-01-22 NOTE — Telephone Encounter (Signed)
-----   Message from Lora PaulaJosalyn C Funches, MD sent at 01/09/2014  9:03 AM EST ----- HIV negative Normal TSH, CMP, CBC Vit B12 is borderline low and could be contributing to some of the neurological symptoms. Plan for f/u labs (MMA and homocysteine levels). Plan for treatment with oral vit b12 1000 mg daily sent to pharmacy.

## 2014-01-24 ENCOUNTER — Ambulatory Visit: Payer: Medicaid Other | Attending: Family Medicine

## 2014-01-24 ENCOUNTER — Ambulatory Visit: Payer: Medicaid Other | Admitting: Physical Therapy

## 2014-01-24 DIAGNOSIS — M5432 Sciatica, left side: Secondary | ICD-10-CM | POA: Diagnosis present

## 2014-01-24 DIAGNOSIS — E538 Deficiency of other specified B group vitamins: Secondary | ICD-10-CM

## 2014-01-24 DIAGNOSIS — G8929 Other chronic pain: Secondary | ICD-10-CM

## 2014-01-24 DIAGNOSIS — M5416 Radiculopathy, lumbar region: Principal | ICD-10-CM

## 2014-01-24 DIAGNOSIS — M5387 Other specified dorsopathies, lumbosacral region: Secondary | ICD-10-CM

## 2014-01-24 LAB — HOMOCYSTEINE: Homocysteine: 15.6 umol/L — ABNORMAL HIGH (ref 4.0–15.4)

## 2014-01-24 MED ORDER — CYANOCOBALAMIN 500 MCG PO TABS: 1000.0000 ug | ORAL_TABLET | Freq: Every day | ORAL | Status: AC

## 2014-01-24 MED ORDER — CANE MISC: 1.0000 | Freq: Every day | Status: DC

## 2014-01-24 NOTE — Patient Instructions (Signed)
Elbow Prop (Extension)   Prop body up on elbows for __5-10__ seconds. Slowly lower it. Repeat _10___ times. Do _1-2___ sessions per day.  Extension   Lie face down, hands close to chest. Press trunk up, arching back. Keep neck long, shoulders down. Tighten buttocks to protect lower back. Hold _1-2___ seconds. Repeat _10___ times. Do __1-2__ sessions per day.  Backward Bend (Standing)   Arch backward to make hollow of back deeper. Hold __1-2__ seconds. Repeat _10___ times per set. Do __1_ sets per session. Do _1-2___ sessions per day.  Clarita CraneStephanie F Loney Peto, PT, DPT 01/24/2014 9:16 AM  [facility] [address] [place], [place] [address]  [phone] (office) [phone] (fax)

## 2014-01-24 NOTE — Therapy (Signed)
[facility] [address] [place], [place], [address] Phone: [phone]   Fax:  [phone]  Physical Therapy Evaluation  Patient Details  Name: Bruce DarterJames Bates MRN: [id number] Date of Birth: 03/20/1965  Encounter Date: 01/24/2014      PT End of Session - 01/24/14 0917    Visit Number 1   Number of Visits 1   PT Start Time 0953   PT Stop Time 0923   PT Time Calculation (min) 1410 min   Activity Tolerance Patient limited by pain   Behavior During Therapy [facility] for tasks assessed/performed      No past medical history on file.  Past Surgical History  Procedure Laterality Date  . Lasik  2006     unsure of which eye, likely L eye     There were no vitals taken for this visit.  Visit Diagnosis:  Sciatica of left side associated with disorder of lumbosacral spine - Plan: PT plan of care cert/re-cert      Subjective Assessment - 01/24/14 0856    Symptoms Pt is a 48 y/o male who presents to [facility] for back pain that radiates down L foot.  Pt reports calf is numb.     Limitations Sitting;Standing;Walking   How long can you sit comfortably? 5 min   How long can you stand comfortably? 5 min   How long can you walk comfortably? 10 min   Diagnostic tests MRI revealed herniated disc   Patient Stated Goals sit without pain, relieve pain   Currently in Pain? Yes   Pain Score 9    Pain Location Back   Pain Orientation Left;Lower   Pain Descriptors / Indicators Shooting   Pain Radiating Towards LLE   Pain Onset More than a month ago  2 months   Aggravating Factors  sitting, standing   Pain Relieving Factors rest   Multiple Pain Sites No          [facility] PT Assessment - 01/24/14 0901    Assessment   Medical Diagnosis low back pain with herniated disc   Onset Date 11/01/13  approx date   Next MD Visit 01/28/14   Prior Therapy n/a   Precautions   Precautions None   Restrictions   Weight Bearing Restrictions No   Balance  Screen   Has the patient fallen in the past 6 months Yes   How many times? 4   Has the patient had a decrease in activity level because of a fear of falling?  Yes   Is the patient reluctant to leave their home because of a fear of falling?  No   Home Environment   Living Enviornment Private residence   Living Arrangements Non-relatives/Friends   Available Help at Discharge Friend(s);Available PRN/intermittently   Type of Home House   Home Access Stairs to enter   Entrance Stairs-Number of Steps 1   Home Layout One level   Prior Function   Level of Independence Independent with gait;Independent with transfers;Independent with basic ADLs   Vocation Unemployed   Cognition   Overall Cognitive Status Within Functional Limits for tasks assessed   Observation/Other Assessments   Observations repeated extension in standing and prone increased symptoms in back however pt unable to provide clear indication if centralization occured.  Pt with R lateral lean in sitting   Posture/Postural Control   Posture/Postural Control Postural limitations   Postural Limitations Decreased lumbar lordosis;Posterior pelvic tilt;Flexed trunk;Weight shift right   AROM   Lumbar Flexion  25   Lumbar Extension 18   Strength   Right Hip Flexion 5/5   Right Hip Extension 5/5   Right Hip ABduction 4/5   Left Hip Flexion 4/5   Left Hip Extension 3/5   Left Hip ABduction 3/5   Right Knee Flexion 5/5   Right Knee Extension 5/5   Left Knee Flexion 3+/5   Left Knee Extension 4/5   Right Ankle Dorsiflexion 5/5   Left Ankle Dorsiflexion 4/5   Special Tests    Special Tests Lumbar;Sacrolliac Tests  negative SLR on R; pain on lateral thigh with L SLR   Sacroiliac Tests  Pelvic Compression   Pelvic Dictraction   Findings Negative   Pelvic Compression   Findings Negative   Ambulation/Gait   Ambulation/Gait Yes   Gait Pattern Decreased stance time - left  L foot slap                           PT Education - 01/24/14 0916    Education provided Yes   Education Details HEP   Person(s) Educated Patient   Methods Explanation;Handout;Verbal cues;Tactile cues;Demonstration   Comprehension Returned demonstration;Verbalized understanding;Tactile cues required;Verbal cues required                    Plan - 01/24/14 [id number]    Clinical Impression Statement Pt presents today with symptoms consistent with radicular low back pain.  Pt difficult to explain symptoms with testing and difficulty following commands.  Provided HEP to address deficits and recommend  attending scheduled neurosurgery appt.   Pt will benefit from skilled therapeutic intervention in order to improve on the following deficits Abnormal gait;Decreased strength;Pain;Postural dysfunction;Improper body mechanics;Decreased range of motion   PT Next Visit Plan one time visit only due to insurance limitation   Consulted and Agree with Plan of Care Patient     FOTO not completed as pt arrived late for appt and 1 time visit only.    Problem List Patient Active Problem List   Diagnosis Date Noted  . Herniated lumbar intervertebral disc 01/11/2014  . Low serum vitamin B12 01/09/2014  . Chronic radicular pain of lower back 01/08/2014  . Loss of weight 01/08/2014   Clarita CraneStephanie F Mckenna Boruff, PT, DPT 01/24/2014 9:32 AM  [facility] [address] [place], [place], [address] Phone: [phone]   Fax:  [phone]

## 2014-01-28 LAB — METHYLMALONIC ACID, SERUM: Methylmalonic Acid, Quant: 97 nmol/L (ref 87–318)

## 2014-01-29 ENCOUNTER — Other Ambulatory Visit (HOSPITAL_COMMUNITY): Payer: Self-pay | Admitting: Neurosurgery

## 2014-02-12 ENCOUNTER — Encounter (HOSPITAL_COMMUNITY)
Admission: RE | Admit: 2014-02-12 | Discharge: 2014-02-12 | Disposition: A | Discharge status: Home / Self Care | Payer: Medicaid Other | Source: Ambulatory Visit | Attending: Neurosurgery | Admitting: Neurosurgery

## 2014-02-12 ENCOUNTER — Encounter (HOSPITAL_COMMUNITY): Payer: Self-pay

## 2014-02-12 DIAGNOSIS — Z01812 Encounter for preprocedural laboratory examination: Secondary | ICD-10-CM | POA: Diagnosis present

## 2014-02-12 DIAGNOSIS — M5126 Other intervertebral disc displacement, lumbar region: Secondary | ICD-10-CM | POA: Insufficient documentation

## 2014-02-12 DIAGNOSIS — Z01818 Encounter for other preprocedural examination: Secondary | ICD-10-CM | POA: Insufficient documentation

## 2014-02-12 HISTORY — DX: Unspecified osteoarthritis, unspecified site: M19.90

## 2014-02-12 HISTORY — DX: Other specified health status: Z78.9

## 2014-02-12 LAB — CBC
HCT: 42.4 % (ref 39.0–52.0)
HEMOGLOBIN: 14.3 g/dL (ref 13.0–17.0)
MCH: 29.5 pg (ref 26.0–34.0)
MCHC: 33.7 g/dL (ref 30.0–36.0)
MCV: 87.6 fL (ref 78.0–100.0)
Platelets: 192 10*3/uL (ref 150–400)
RBC: 4.84 MIL/uL (ref 4.22–5.81)
RDW: 14 % (ref 11.5–15.5)
WBC: 4 10*3/uL (ref 4.0–10.5)

## 2014-02-12 LAB — BASIC METABOLIC PANEL
Anion gap: 7 (ref 5–15)
BUN: 12 mg/dL (ref 6–23)
CHLORIDE: 105 meq/L (ref 96–112)
CO2: 27 mmol/L (ref 19–32)
CREATININE: 1.1 mg/dL (ref 0.50–1.35)
Calcium: 9.2 mg/dL (ref 8.4–10.5)
GFR, EST NON AFRICAN AMERICAN: 78 mL/min — AB (ref >90)
Glucose, Bld: 90 mg/dL (ref 70–99)
POTASSIUM: 4.1 mmol/L (ref 3.5–5.1)
Sodium: 139 mmol/L (ref 135–145)

## 2014-02-12 LAB — SURGICAL PCR SCREEN
MRSA, PCR: NEGATIVE
Staphylococcus aureus: POSITIVE — AB

## 2014-02-12 NOTE — Pre-Procedure Instructions (Addendum)
Bruce DarterJames Bates  02/12/2014   Your procedure is scheduled on:  Tuesday, Jan. 19th   [facility] Admitting at  9:00 AM.  Call this number if you have problems the morning of surgery: [phone]   Remember:   Do not eat food or drink liquids after midnight Monday.   Take these medicines the morning of surgery with A SIP OF WATER: Hydrocodone, Flexeril, Gabapentin   Do not wear jewelry - no rings or watches.  Do not wear lotions or colognes.  You may NOT wear deodorant the day of surgery.   Men may shave face and neck.   Do not bring valuables to the hospital.  [facility] is not [facility] for any belongings or valuables.               Contacts, dentures or bridgework may not be worn into surgery.  Leave suitcase in the car. After surgery it may be brought to your room.  For patients admitted to the hospital, discharge time is determined by your treatment team.               Name and phone number of your driver:    Special Instructions: "Preparing for Surgery" instruction sheet.   Please read over the following fact sheets that you were given: Pain Booklet, Coughing and Deep Breathing, MRSA Information and Surgical Site Infection Prevention

## 2014-02-18 MED ORDER — CEFAZOLIN SODIUM-DEXTROSE 2-3 GM-% IV SOLR
2.0000 g | INTRAVENOUS | Status: AC
  Administered 2014-02-19: 2 g via INTRAVENOUS
  Filled 2014-02-18: qty 50

## 2014-02-18 NOTE — Discharge Instructions (Signed)
Wound Care Keep incision covered and dry for two days.  Do not put any creams, lotions, or ointments on incision. Leave incision open to air. Activity Walk each and every day, increasing distance each day. No lifting greater than 5 lbs.  Avoid excessive neck motion. No driving for 2 weeks; may ride as a passenger locally.  Diet Resume your normal diet.  Return to Work Will be discussed at you follow up appointment. Call Your Doctor If Any of These Occur Redness, drainage, or swelling at the wound.  Temperature greater than 101 degrees. Severe pain not relieved by pain medication. Incision starts to come apart. Follow Up Appt Call today for appointment in 1-2 weeks [phone]) or for problems.  If you have any hardware placed in your spine, you will need an x-ray before your appointment.

## 2014-02-19 ENCOUNTER — Inpatient Hospital Stay (HOSPITAL_COMMUNITY): Payer: Medicaid Other | Admitting: Certified Registered Nurse Anesthetist

## 2014-02-19 ENCOUNTER — Encounter (HOSPITAL_COMMUNITY)
Admission: RE | Disposition: A | Discharge status: Home / Self Care | Payer: Self-pay | Source: Ambulatory Visit | Attending: Neurosurgery

## 2014-02-19 ENCOUNTER — Observation Stay (HOSPITAL_COMMUNITY)
Admission: RE | Admit: 2014-02-19 | Discharge: 2014-02-19 | Disposition: A | Discharge status: Home / Self Care | Payer: Medicaid Other | Source: Ambulatory Visit | Attending: Neurosurgery | Admitting: Neurosurgery

## 2014-02-19 ENCOUNTER — Observation Stay (HOSPITAL_COMMUNITY): Payer: Medicaid Other

## 2014-02-19 ENCOUNTER — Inpatient Hospital Stay (HOSPITAL_COMMUNITY): Payer: Medicaid Other

## 2014-02-19 ENCOUNTER — Encounter (HOSPITAL_COMMUNITY): Payer: Self-pay | Admitting: Surgery

## 2014-02-19 DIAGNOSIS — Z419 Encounter for procedure for purposes other than remedying health state, unspecified: Secondary | ICD-10-CM

## 2014-02-19 DIAGNOSIS — F17210 Nicotine dependence, cigarettes, uncomplicated: Secondary | ICD-10-CM | POA: Insufficient documentation

## 2014-02-19 DIAGNOSIS — M5126 Other intervertebral disc displacement, lumbar region: Secondary | ICD-10-CM | POA: Diagnosis present

## 2014-02-19 DIAGNOSIS — M5116 Intervertebral disc disorders with radiculopathy, lumbar region: Secondary | ICD-10-CM | POA: Diagnosis present

## 2014-02-19 DIAGNOSIS — M13811 Other specified arthritis, right shoulder: Secondary | ICD-10-CM | POA: Diagnosis not present

## 2014-02-19 DIAGNOSIS — M19011 Primary osteoarthritis, right shoulder: Secondary | ICD-10-CM | POA: Insufficient documentation

## 2014-02-19 HISTORY — PX: LUMBAR LAMINECTOMY/DECOMPRESSION MICRODISCECTOMY: SHX5026

## 2014-02-19 LAB — CBC
HCT: 37.9 % — ABNORMAL LOW (ref 39.0–52.0)
HEMOGLOBIN: 12.9 g/dL — AB (ref 13.0–17.0)
MCH: 29 pg (ref 26.0–34.0)
MCHC: 34 g/dL (ref 30.0–36.0)
MCV: 85.2 fL (ref 78.0–100.0)
PLATELETS: 181 10*3/uL (ref 150–400)
RBC: 4.45 MIL/uL (ref 4.22–5.81)
RDW: 13.8 % (ref 11.5–15.5)
WBC: 6.8 10*3/uL (ref 4.0–10.5)

## 2014-02-19 LAB — CREATININE, SERUM
CREATININE: 1.16 mg/dL (ref 0.50–1.35)
GFR calc Af Amer: 85 mL/min — ABNORMAL LOW (ref >90)
GFR calc non Af Amer: 73 mL/min — ABNORMAL LOW (ref >90)

## 2014-02-19 SURGERY — LUMBAR LAMINECTOMY/DECOMPRESSION MICRODISCECTOMY 1 LEVEL
Anesthesia: General | Laterality: Left

## 2014-02-19 MED ORDER — DIAZEPAM 5 MG PO TABS: 5.0000 mg | ORAL_TABLET | Freq: Four times a day (QID) | ORAL | Status: AC | PRN

## 2014-02-19 MED ORDER — LACTATED RINGERS IV SOLN
INTRAVENOUS | Status: DC | PRN
  Administered 2014-02-19 (×2): via INTRAVENOUS

## 2014-02-19 MED ORDER — HEPARIN SODIUM (PORCINE) 5000 UNIT/ML IJ SOLN
5000.0000 [IU] | Freq: Three times a day (TID) | INTRAMUSCULAR | Status: DC
  Filled 2014-02-19: qty 1

## 2014-02-19 MED ORDER — LIDOCAINE-EPINEPHRINE 1 %-1:100000 IJ SOLN
INTRAMUSCULAR | Status: DC | PRN
  Administered 2014-02-19: 3.5 mL

## 2014-02-19 MED ORDER — LIDOCAINE HCL (CARDIAC) 20 MG/ML IV SOLN
INTRAVENOUS | Status: AC
  Filled 2014-02-19: qty 10

## 2014-02-19 MED ORDER — BACITRACIN 50000 UNITS IM SOLR
INTRAMUSCULAR | Status: DC | PRN
  Administered 2014-02-19: 14:00:00

## 2014-02-19 MED ORDER — PROMETHAZINE HCL 25 MG/ML IJ SOLN: 6.2500 mg | INTRAMUSCULAR | Status: DC | PRN

## 2014-02-19 MED ORDER — HYDROMORPHONE HCL 1 MG/ML IJ SOLN
INTRAMUSCULAR | Status: AC
  Administered 2014-02-19: 0.5 mg
  Filled 2014-02-19: qty 1

## 2014-02-19 MED ORDER — LIDOCAINE HCL (CARDIAC) 20 MG/ML IV SOLN
INTRAVENOUS | Status: DC | PRN
  Administered 2014-02-19: 100 mg via INTRAVENOUS

## 2014-02-19 MED ORDER — DIAZEPAM 5 MG PO TABS: 5.0000 mg | ORAL_TABLET | Freq: Four times a day (QID) | ORAL | Status: DC | PRN

## 2014-02-19 MED ORDER — GLYCOPYRROLATE 0.2 MG/ML IJ SOLN
INTRAMUSCULAR | Status: AC
  Filled 2014-02-19: qty 4

## 2014-02-19 MED ORDER — OXYCODONE-ACETAMINOPHEN 10-325 MG PO TABS: 1.0000 | ORAL_TABLET | ORAL | Status: DC | PRN

## 2014-02-19 MED ORDER — HEMOSTATIC AGENTS (NO CHARGE) OPTIME
TOPICAL | Status: DC | PRN
  Administered 2014-02-19: 1 via TOPICAL

## 2014-02-19 MED ORDER — PHENOL 1.4 % MT LIQD: 1.0000 | OROMUCOSAL | Status: DC | PRN

## 2014-02-19 MED ORDER — ACETAMINOPHEN 325 MG PO TABS: 650.0000 mg | ORAL_TABLET | ORAL | Status: DC | PRN

## 2014-02-19 MED ORDER — ROCURONIUM BROMIDE 50 MG/5ML IV SOLN
INTRAVENOUS | Status: AC
  Filled 2014-02-19: qty 1

## 2014-02-19 MED ORDER — VECURONIUM BROMIDE 10 MG IV SOLR
INTRAVENOUS | Status: AC
  Filled 2014-02-19: qty 10

## 2014-02-19 MED ORDER — ONDANSETRON HCL 4 MG/2ML IJ SOLN
INTRAMUSCULAR | Status: AC
  Filled 2014-02-19: qty 2

## 2014-02-19 MED ORDER — MIDAZOLAM HCL 2 MG/2ML IJ SOLN
INTRAMUSCULAR | Status: AC
  Filled 2014-02-19: qty 2

## 2014-02-19 MED ORDER — 0.9 % SODIUM CHLORIDE (POUR BTL) OPTIME
TOPICAL | Status: DC | PRN
  Administered 2014-02-19: 1000 mL

## 2014-02-19 MED ORDER — SODIUM CHLORIDE 0.9 % IV SOLN: INTRAVENOUS | Status: DC

## 2014-02-19 MED ORDER — HYDROMORPHONE HCL 1 MG/ML IJ SOLN
0.2500 mg | INTRAMUSCULAR | Status: DC | PRN
  Administered 2014-02-19 (×3): 0.5 mg via INTRAVENOUS

## 2014-02-19 MED ORDER — SODIUM CHLORIDE 0.9 % IJ SOLN: 3.0000 mL | Freq: Two times a day (BID) | INTRAMUSCULAR | Status: DC

## 2014-02-19 MED ORDER — GABAPENTIN 300 MG PO CAPS
300.0000 mg | ORAL_CAPSULE | Freq: Three times a day (TID) | ORAL | Status: DC
  Filled 2014-02-19 (×2): qty 1

## 2014-02-19 MED ORDER — VITAMIN B-12 1000 MCG PO TABS
1000.0000 ug | ORAL_TABLET | Freq: Every day | ORAL | Status: DC
  Filled 2014-02-19: qty 1

## 2014-02-19 MED ORDER — BUPIVACAINE HCL (PF) 0.5 % IJ SOLN
INTRAMUSCULAR | Status: DC | PRN
  Administered 2014-02-19: 3.5 mL

## 2014-02-19 MED ORDER — MORPHINE SULFATE 2 MG/ML IJ SOLN: 1.0000 mg | INTRAMUSCULAR | Status: DC | PRN

## 2014-02-19 MED ORDER — HYDROMORPHONE HCL 1 MG/ML IJ SOLN: 0.2500 mg | INTRAMUSCULAR | Status: DC | PRN

## 2014-02-19 MED ORDER — ONDANSETRON HCL 4 MG/2ML IJ SOLN: 4.0000 mg | INTRAMUSCULAR | Status: DC | PRN

## 2014-02-19 MED ORDER — NEOSTIGMINE METHYLSULFATE 10 MG/10ML IV SOLN
INTRAVENOUS | Status: DC | PRN
  Administered 2014-02-19: 4 mg via INTRAVENOUS

## 2014-02-19 MED ORDER — SENNA 8.6 MG PO TABS: 1.0000 | ORAL_TABLET | Freq: Two times a day (BID) | ORAL | Status: DC

## 2014-02-19 MED ORDER — SODIUM CHLORIDE 0.9 % IJ SOLN: 3.0000 mL | INTRAMUSCULAR | Status: DC | PRN

## 2014-02-19 MED ORDER — THROMBIN 5000 UNITS EX SOLR
OROMUCOSAL | Status: DC | PRN
  Administered 2014-02-19: 14:00:00 via TOPICAL

## 2014-02-19 MED ORDER — NAPROXEN 500 MG PO TABS
500.0000 mg | ORAL_TABLET | Freq: Two times a day (BID) | ORAL | Status: DC
  Filled 2014-02-19 (×2): qty 1

## 2014-02-19 MED ORDER — FENTANYL CITRATE 0.05 MG/ML IJ SOLN
INTRAMUSCULAR | Status: DC | PRN
  Administered 2014-02-19: 50 ug via INTRAVENOUS
  Administered 2014-02-19: 100 ug via INTRAVENOUS
  Administered 2014-02-19 (×2): 50 ug via INTRAVENOUS

## 2014-02-19 MED ORDER — CEFAZOLIN SODIUM 1-5 GM-% IV SOLN
1.0000 g | Freq: Three times a day (TID) | INTRAVENOUS | Status: DC
  Filled 2014-02-19 (×2): qty 50

## 2014-02-19 MED ORDER — MENTHOL 3 MG MT LOZG: 1.0000 | LOZENGE | OROMUCOSAL | Status: DC | PRN

## 2014-02-19 MED ORDER — METHYLPREDNISOLONE ACETATE 80 MG/ML IJ SUSP
INTRAMUSCULAR | Status: DC | PRN
  Administered 2014-02-19: 80 mg

## 2014-02-19 MED ORDER — OXYCODONE-ACETAMINOPHEN 5-325 MG PO TABS
1.0000 | ORAL_TABLET | ORAL | Status: DC | PRN
  Administered 2014-02-19: 2 via ORAL
  Filled 2014-02-19: qty 2

## 2014-02-19 MED ORDER — FENTANYL CITRATE 0.05 MG/ML IJ SOLN
INTRAMUSCULAR | Status: AC
  Filled 2014-02-19: qty 5

## 2014-02-19 MED ORDER — ACETAMINOPHEN 650 MG RE SUPP: 650.0000 mg | RECTAL | Status: DC | PRN

## 2014-02-19 MED ORDER — MIDAZOLAM HCL 5 MG/5ML IJ SOLN
INTRAMUSCULAR | Status: DC | PRN
  Administered 2014-02-19: 2 mg via INTRAVENOUS

## 2014-02-19 MED ORDER — THROMBIN 5000 UNITS EX SOLR
CUTANEOUS | Status: DC | PRN
  Administered 2014-02-19 (×2): 5000 [IU] via TOPICAL

## 2014-02-19 MED ORDER — ONDANSETRON HCL 4 MG/2ML IJ SOLN
INTRAMUSCULAR | Status: DC | PRN
  Administered 2014-02-19: 4 mg via INTRAVENOUS

## 2014-02-19 MED ORDER — DOCUSATE SODIUM 100 MG PO CAPS: 100.0000 mg | ORAL_CAPSULE | Freq: Two times a day (BID) | ORAL | Status: DC

## 2014-02-19 MED ORDER — NEOSTIGMINE METHYLSULFATE 10 MG/10ML IV SOLN
INTRAVENOUS | Status: AC
  Filled 2014-02-19: qty 1

## 2014-02-19 MED ORDER — PROPOFOL 10 MG/ML IV BOLUS
INTRAVENOUS | Status: DC | PRN
  Administered 2014-02-19: 200 mg via INTRAVENOUS

## 2014-02-19 MED ORDER — SUCCINYLCHOLINE CHLORIDE 20 MG/ML IJ SOLN
INTRAMUSCULAR | Status: AC
  Filled 2014-02-19: qty 1

## 2014-02-19 MED ORDER — GLYCOPYRROLATE 0.2 MG/ML IJ SOLN
INTRAMUSCULAR | Status: DC | PRN
  Administered 2014-02-19: 0.6 mg via INTRAVENOUS

## 2014-02-19 MED ORDER — HYDROMORPHONE HCL 1 MG/ML IJ SOLN
INTRAMUSCULAR | Status: AC
  Filled 2014-02-19: qty 1

## 2014-02-19 MED ORDER — ARTIFICIAL TEARS OP OINT
TOPICAL_OINTMENT | OPHTHALMIC | Status: AC
  Filled 2014-02-19: qty 3.5

## 2014-02-19 MED ORDER — PROPOFOL 10 MG/ML IV BOLUS
INTRAVENOUS | Status: AC
  Filled 2014-02-19: qty 20

## 2014-02-19 MED ORDER — SCOPOLAMINE 1 MG/3DAYS TD PT72
MEDICATED_PATCH | TRANSDERMAL | Status: AC
  Filled 2014-02-19: qty 1

## 2014-02-19 MED ORDER — ROCURONIUM BROMIDE 100 MG/10ML IV SOLN
INTRAVENOUS | Status: DC | PRN
  Administered 2014-02-19: 50 mg via INTRAVENOUS

## 2014-02-19 MED ORDER — LACTATED RINGERS IV SOLN
INTRAVENOUS | Status: DC
  Administered 2014-02-19: 10:00:00 via INTRAVENOUS

## 2014-02-19 SURGICAL SUPPLY — 57 items
BAG DECANTER FOR FLEXI CONT (MISCELLANEOUS) ×3 IMPLANT
BENZOIN TINCTURE PRP APPL 2/3 (GAUZE/BANDAGES/DRESSINGS) IMPLANT
BLADE CLIPPER SURG (BLADE) IMPLANT
BLADE SURG 11 STRL SS (BLADE) ×3 IMPLANT
BUR MATCHSTICK NEURO 3.0 LAGG (BURR) ×3 IMPLANT
CANISTER SUCT 3000ML (MISCELLANEOUS) ×3 IMPLANT
CLOSURE WOUND 1/2 X4 (GAUZE/BANDAGES/DRESSINGS)
CONT SPEC 4OZ CLIKSEAL STRL BL (MISCELLANEOUS) ×3 IMPLANT
DECANTER SPIKE VIAL GLASS SM (MISCELLANEOUS) ×3 IMPLANT
DRAPE LAPAROTOMY 100X72X124 (DRAPES) ×3 IMPLANT
DRAPE MICROSCOPE LEICA (MISCELLANEOUS) ×3 IMPLANT
DRAPE POUCH INSTRU U-SHP 10X18 (DRAPES) ×3 IMPLANT
DRAPE SURG 17X23 STRL (DRAPES) ×3 IMPLANT
DRSG OPSITE POSTOP 3X4 (GAUZE/BANDAGES/DRESSINGS) ×3 IMPLANT
DURAPREP 26ML APPLICATOR (WOUND CARE) ×3 IMPLANT
ELECT REM PT RETURN 9FT ADLT (ELECTROSURGICAL) ×3
ELECTRODE REM PT RTRN 9FT ADLT (ELECTROSURGICAL) ×1 IMPLANT
GAUZE SPONGE 4X4 12PLY STRL (GAUZE/BANDAGES/DRESSINGS) IMPLANT
GAUZE SPONGE 4X4 16PLY XRAY LF (GAUZE/BANDAGES/DRESSINGS) IMPLANT
GLOVE BIOGEL PI IND STRL 7.5 (GLOVE) ×4 IMPLANT
GLOVE BIOGEL PI INDICATOR 7.5 (GLOVE) ×8
GLOVE ECLIPSE 7.0 STRL STRAW (GLOVE) ×3 IMPLANT
GLOVE ECLIPSE 7.5 STRL STRAW (GLOVE) ×6 IMPLANT
GLOVE ECLIPSE 8.0 STRL XLNG CF (GLOVE) ×3 IMPLANT
GLOVE EXAM NITRILE LRG STRL (GLOVE) IMPLANT
GLOVE EXAM NITRILE MD LF STRL (GLOVE) IMPLANT
GLOVE EXAM NITRILE XL STR (GLOVE) IMPLANT
GLOVE EXAM NITRILE XS STR PU (GLOVE) IMPLANT
GLOVE SURG SS PI 7.0 STRL IVOR (GLOVE) ×6 IMPLANT
GOWN STRL REUS W/ TWL LRG LVL3 (GOWN DISPOSABLE) ×1 IMPLANT
GOWN STRL REUS W/ TWL XL LVL3 (GOWN DISPOSABLE) ×3 IMPLANT
GOWN STRL REUS W/TWL 2XL LVL3 (GOWN DISPOSABLE) IMPLANT
GOWN STRL REUS W/TWL LRG LVL3 (GOWN DISPOSABLE) ×2
GOWN STRL REUS W/TWL XL LVL3 (GOWN DISPOSABLE) ×6
HEMOSTAT POWDER KIT SURGIFOAM (HEMOSTASIS) ×3 IMPLANT
KIT BASIN OR (CUSTOM PROCEDURE TRAY) ×3 IMPLANT
KIT ROOM TURNOVER OR (KITS) ×3 IMPLANT
LIQUID BAND (GAUZE/BANDAGES/DRESSINGS) ×3 IMPLANT
NEEDLE HYPO 18GX1.5 BLUNT FILL (NEEDLE) IMPLANT
NEEDLE HYPO 25X1 1.5 SAFETY (NEEDLE) ×3 IMPLANT
NEEDLE SPNL 18GX3.5 QUINCKE PK (NEEDLE) ×3 IMPLANT
NS IRRIG 1000ML POUR BTL (IV SOLUTION) ×3 IMPLANT
PACK LAMINECTOMY NEURO (CUSTOM PROCEDURE TRAY) ×3 IMPLANT
PAD ARMBOARD 7.5X6 YLW CONV (MISCELLANEOUS) ×9 IMPLANT
RUBBERBAND STERILE (MISCELLANEOUS) ×6 IMPLANT
SPONGE LAP 4X18 X RAY DECT (DISPOSABLE) IMPLANT
SPONGE SURGIFOAM ABS GEL SZ50 (HEMOSTASIS) ×3 IMPLANT
STRIP CLOSURE SKIN 1/2X4 (GAUZE/BANDAGES/DRESSINGS) IMPLANT
SUT VIC AB 0 CT1 18XCR BRD8 (SUTURE) ×1 IMPLANT
SUT VIC AB 0 CT1 8-18 (SUTURE) ×2
SUT VIC AB 2-0 CT1 18 (SUTURE) IMPLANT
SUT VICRYL 3-0 RB1 18 ABS (SUTURE) ×3 IMPLANT
SYR 20ML ECCENTRIC (SYRINGE) ×3 IMPLANT
SYR 3ML LL SCALE MARK (SYRINGE) IMPLANT
TOWEL OR 17X24 6PK STRL BLUE (TOWEL DISPOSABLE) ×3 IMPLANT
TOWEL OR 17X26 10 PK STRL BLUE (TOWEL DISPOSABLE) ×3 IMPLANT
WATER STERILE IRR 1000ML POUR (IV SOLUTION) ×3 IMPLANT

## 2014-02-19 NOTE — Plan of Care (Signed)
Problem: Consults Goal: Diagnosis - Spinal Surgery Outcome: Completed/Met Date Met:  02/19/14 Microdiscectomy

## 2014-02-19 NOTE — H&P (Signed)
CC:  Left leg pain  HPI: Bruce DarterJames Bates is a 49 y.o. male with about 4 months of left leg pain starting when he was getting his lawnmower out of a shed. His pain radiates into the left buttock, hip, and calf. He has no significant foot symptoms. He has been taking Naprosyn and narcotic meds without much relief. He now presents for surgical decompression.  PMH: Past Medical History  Diagnosis Date  . Arthritis     right shoulder  . Medical history non-contributory     has never had any surgeries    PSH: Past Surgical History  Procedure Laterality Date  . Lasik  2006     unsure of which eye, likely L eye   . Eye surgery      left eye  -- "white dots in his eye"    SH: History  Substance Use Topics  . Smoking status: Current Some Day Smoker -- 0.50 packs/day for 25 years    Types: Cigarettes  . Smokeless tobacco: Former NeurosurgeonUser  . Alcohol Use: No    MEDS: Prior to Admission medications   Medication Sig Start Date End Date Taking? Authorizing Provider  cyanocobalamin 500 MCG tablet Take 2 tablets (1,000 mcg total) by mouth daily. 01/24/14  Yes Quentin Angstlugbemiga E Jegede, MD  gabapentin (NEURONTIN) 300 MG capsule Take 1 capsule (300 mg total) by mouth 3 (three) times daily. 01/08/14  Yes Josalyn C Funches, MD  HYDROcodone-acetaminophen (NORCO/VICODIN) 5-325 MG per tablet Take 1 tablet by mouth every 6 (six) hours as needed for moderate pain.   Yes Historical Provider, MD  naproxen (NAPROSYN) 500 MG tablet Take 1 tablet (500 mg total) by mouth 2 (two) times daily with a meal. 01/11/14  Yes Josalyn C Funches, MD  cyclobenzaprine (FLEXERIL) 10 MG tablet Take 1 tablet (10 mg total) by mouth 3 times/day as needed-between meals & bedtime for muscle spasms. 01/08/14   Lora PaulaJosalyn C Funches, MD    ALLERGY: Allergies  Allergen Reactions  . Gabapentin Other (See Comments)    Made pt feel depressed grouchy    ROS: ROS  NEUROLOGIC EXAM: Awake, alert, oriented Memory and concentration grossly  intact Speech fluent, appropriate CN grossly intact Motor exam: Upper Extremities Deltoid Bicep Tricep Grip  Right 5/5 5/5 5/5 5/5  Left 5/5 5/5 5/5 5/5   Lower Extremity IP Quad PF DF EHL  Right 5/5 5/5 5/5 5/5 5/5  Left 5/5 5/5 5/5 5/5 5/5   Sensation grossly intact to LT  IMGAING: MRI demonstrates superiorly migrated left L4-5 disc herniation.  IMPRESSION: - 49 y.o. male with left radiculopathy and left L4-5 disc herniation  PLAN: - Proceed with surgical decompression via Left L4-5 laminotomy, microdiscectomy.  The treatment options, risks, and benefits of each were discussed in detail with the patient in the office. After all questions were answered, he elected to proceed with surgery. Informed consent was obtained.

## 2014-02-19 NOTE — Transfer of Care (Signed)
Immediate Anesthesia Transfer of Care Note  Patient: Bruce DarterJames Bates  Procedure(s) Performed: Procedure(s) with comments: Left Lumbar four-five microdiskectomy (Left) - Left Lumbar four-five microdiskectomy  Patient Location: PACU  Anesthesia Type:General  Level of Consciousness: awake and alert   Airway & Oxygen Therapy: Patient Spontanous Breathing and Patient connected to nasal cannula oxygen  Post-op Assessment: Report given to PACU RN and Post -op Vital signs reviewed and stable  Post vital signs: Reviewed and stable  Complications: No apparent anesthesia complications

## 2014-02-19 NOTE — Anesthesia Procedure Notes (Signed)
Procedure Name: Intubation Date/Time: 02/19/2014 12:42 PM Performed by: [place] Pink Pre-anesthesia Checklist: Patient identified, Emergency Drugs available, Suction available, Patient being monitored and Timeout performed Patient Re-evaluated:Patient Re-evaluated prior to inductionOxygen Delivery Method: Circle system utilized Preoxygenation: Pre-oxygenation with 100% oxygen Intubation Type: IV induction Ventilation: Mask ventilation without difficulty Laryngoscope Size: Mac and 4 Grade View: Grade II Tube type: Oral Tube size: 7.5 mm Number of attempts: 1 Airway Equipment and Method: Stylet and LTA kit utilized Placement Confirmation: ETT inserted through vocal cords under direct vision,  positive ETCO2 and breath sounds checked- equal and bilateral Secured at: 22 cm Tube secured with: Tape Dental Injury: Teeth and Oropharynx as per pre-operative assessment

## 2014-02-19 NOTE — Anesthesia Preprocedure Evaluation (Signed)
Anesthesia Evaluation  Patient identified by MRN, date of birth, ID band Patient awake    Reviewed: Allergy & Precautions, NPO status , Patient's Chart, lab work & pertinent test results  History of Anesthesia Complications Negative for: history of anesthetic complications  Airway Mallampati: I       Dental  (+) Teeth Intact   Pulmonary Current Smoker,  breath sounds clear to auscultation        Cardiovascular negative cardio ROS  Rhythm:Regular Rate:Normal     Neuro/Psych    GI/Hepatic negative GI ROS, Neg liver ROS,   Endo/Other  negative endocrine ROS  Renal/GU negative Renal ROS     Musculoskeletal  (+) Arthritis -,   Abdominal   Peds negative pediatric ROS (+)  Hematology   Anesthesia Other Findings   Reproductive/Obstetrics                             Anesthesia Physical Anesthesia Plan  ASA: II  Anesthesia Plan: General   Post-op Pain Management:    Induction: Intravenous  Airway Management Planned: Oral ETT  Additional Equipment:   Intra-op Plan:   Post-operative Plan: Extubation in OR  Informed Consent: I have reviewed the patients History and Physical, chart, labs and discussed the procedure including the risks, benefits and alternatives for the proposed anesthesia with the patient or authorized representative who has indicated his/her understanding and acceptance.   Dental advisory given  Plan Discussed with: CRNA and Surgeon  Anesthesia Plan Comments:         Anesthesia Quick Evaluation

## 2014-02-19 NOTE — Op Note (Signed)
PREOP DIAGNOSIS: Lumbar disc herniation, L4-5  POSTOP DIAGNOSIS: Same  PROCEDURE: 1. Left L4 laminotomy and microdiscectomy for decompression of nerve root 2. Use of operating microscope  SURGEON: Dr. Lisbeth RenshawNeelesh Kaile Bixler, MD  ASSISTANT: Dr. Aliene Beamsandy Kritzer, MD  ANESTHESIA: General Endotracheal  EBL: 50cc  SPECIMENS: None  DRAINS: None  COMPLICATIONS: None immediate  CONDITION: Hemodynamically stable to PACU  HISTORY: Bruce Bates is a 49 y.o. male presenting for elective lumbar discectomy. He has been having left leg pain for the last 4 months unresponsive to conservative treatments. He therefore elected to proceed with surgical decompression. The risks and benefits of the surgery were reviewed with the patient. After all questions were answered, informed consent was obtained.  PROCEDURE IN DETAIL: After informed consent was obtained and witnessed, the patient was brought to the operating room. After induction of general anesthesia, the patient was positioned on the operative table in the prone position with all pressure points meticulously padded. The skin of the low back was then prepped and draped in the usual sterile fashion.  Under Xray, the correct level was identified and marked out on the skin, and after timeout was conducted, the skin was infiltrated with local anesthetic. Skin incision was then made sharply and Bovie electrocautery was used to dissect the subcutaneous tissue until the lumbodorsal fascia was identified. The fashion was then incised using Bovie electrocautery and the lamina at the left L4-5 levels was identified and dissection was carried out in the subperiosteal plane. Self-retaining retractor was then placed, and intraoperative x-ray was taken to confirm we were at the correct level.  Using a high-speed drill, laminotomy was completed with a partial medial facetectomy. The ligamentum flavum was then identified and removed and the lateral edge of the thecal sac  was identified. This was traced superiorly and the exiting right L4 nerve root was identified. A subligamentous disc fragment was identified and removed using a combination of dissectors and currettes. This did appear to extend into the left L4-5 foramen. Once all disc fragments were removed, decompression of the nerve root was confirmed using a dissector.  Hemostasis was then secured using a combination of morcellized Gelfoam and thrombin and bipolar electrocautery. The wound is irrigated with copious amounts of antibiotic saline irrigation. The nerve root was then covered with a long-acting steroid solution. Self-retaining retractor was then removed, and the wound is closed in layers using a combination of interrupted 0 Vicryl and 3-0 Vicryl stitches. The skin was closed using standard skin glue.  At the end of the case all sponge, needle, and instrument counts were correct. The patient was then transferred to the stretcher and taken to the postanesthesia care unit in stable hemodynamic condition.

## 2014-02-19 NOTE — Progress Notes (Signed)
Patient arrived to unit alert and oriented, and started calling out " I am going home, I am not going to be here". RN informed patient he has to stay at least 4-5 hours unless MD order for patient to be discharge sooner. Patient stated " I have to go outside because I cannot breath in this room where is my clothes- call my friend Rob to bring my clothes, I have to get out of here". Patient's friend was called and upon friend entering the room; patient started yelling at friend to give him his clothes and medication bottles. Patient's friend left to go back to waiting area and patient pulled IV out and follow his friend down the hall. Security was notified and the situation was contained. MD was also notified and orders received. Patient to be discharge at 5:30pm, if patient leaves the building without MD order, patient cannot come back. RN will continue to monitor patient. Marin RobertsAisha Leigha Olberding RN

## 2014-02-19 NOTE — Discharge Summary (Signed)
  Physician Discharge Summary  Patient ID: Bruce Bates MRN: [id number] DOB/AGE: 48/05/1965 48 y.o.  Admit date: 02/19/2014 Discharge date: 02/19/2014  Admission Diagnoses: Lumbar disc herniation with radiculopathy, Left L4-5  Discharge Diagnoses: Same Active Problems:   * No active hospital problems. *   Discharged Condition: Stable  Hospital Course:  Mrs. Bruce Bates is a 49 y.o. male who presented to the clinic with left-sided radiculopathy and MRI demonstrating left L4-5 disc herniation. The patient was admitted for elective left L4-5 laminotomy and microdiscectomy which was done without complication. Postoperatively, the patient was at neurologic baseline. Back pain was controlled with oral medication, he was ambulating without difficulty, voiding normally, and tolerating diet.  Treatments: Surgery - left L4-5 laminotomy, microdiscectomy  Discharge Exam: Blood pressure 114/73, pulse 54, temperature 98.5 F (36.9 C), temperature source Oral, resp. rate 20, height 6' (1.829 m), weight 77.905 kg (171 lb 12 oz), SpO2 100 %. Awake, alert, oriented Speech fluent, appropriate CN grossly intact 5/5 BUE/BLE Wound c/d/i  Follow-up: Follow-up in my office [facility] [phone]) in 2-3 weeks  Disposition: 01-Home or Self Care     Medication List    STOP taking these medications        HYDROcodone-acetaminophen 5-325 MG per tablet  Commonly known as:  NORCO/VICODIN      TAKE these medications        cyanocobalamin 500 MCG tablet  Take 2 tablets (1,000 mcg total) by mouth daily.     cyclobenzaprine 10 MG tablet  Commonly known as:  FLEXERIL  Take 1 tablet (10 mg total) by mouth 3 times/day as needed-between meals & bedtime for muscle spasms.     diazepam 5 MG tablet  Commonly known as:  VALIUM  Take 1 tablet (5 mg total) by mouth every 6 (six) hours as needed for anxiety.     gabapentin 300 MG capsule  Commonly known as:  NEURONTIN  Take 1  capsule (300 mg total) by mouth 3 (three) times daily.     naproxen 500 MG tablet  Commonly known as:  NAPROSYN  Take 1 tablet (500 mg total) by mouth 2 (two) times daily with a meal.     oxyCODONE-acetaminophen 10-325 MG per tablet  Commonly known as:  PERCOCET  Take 1 tablet by mouth every 4 (four) hours as needed for pain.           Follow-up Information    [facility] Nolon LennertNEELESH, C, MD.   Specialty:  Neurosurgery   Contact information:   [address] [place] [address] [phone]       Signed: Lisbeth RenshawUNDKUMAR, Shalon Salado, Salena SanerC 02/19/2014, 2:05 PM

## 2014-02-19 NOTE — Anesthesia Postprocedure Evaluation (Signed)
  Anesthesia Post-op Note  Patient: Bruce DarterJames Goya  Procedure(s) Performed: Procedure(s) with comments: Left Lumbar four-five microdiskectomy (Left) - Left Lumbar four-five microdiskectomy  Patient Location: PACU  Anesthesia Type:General  Level of Consciousness: awake  Airway and Oxygen Therapy: Patient Spontanous Breathing  Post-op Pain: mild  Post-op Assessment: Post-op Vital signs reviewed  Post-op Vital Signs: stable  Last Vitals:  Filed Vitals:   02/19/14 1516  BP:   Pulse:   Temp: 36.2 C  Resp:     Complications: No apparent anesthesia complications

## 2014-02-19 NOTE — Progress Notes (Signed)
Patient alert and oriented, mae's well, voiding adequate amount of urine, swallowing without difficulty.  Patient discharged home. Script and discharged instructions given to patient. Patient stated " I heard you, I will take care of my self like I have been doing before". Patient has an appointment with MD. Marin RobertsAisha Araf Clugston RN

## 2014-02-20 ENCOUNTER — Encounter (HOSPITAL_COMMUNITY): Payer: Self-pay | Admitting: Neurosurgery

## 2014-03-31 ENCOUNTER — Encounter (HOSPITAL_COMMUNITY): Payer: Self-pay

## 2014-03-31 ENCOUNTER — Emergency Department (HOSPITAL_COMMUNITY)
Admission: EM | Admit: 2014-03-31 | Discharge: 2014-03-31 | Disposition: A | Discharge status: Home / Self Care | Payer: Medicaid Other | Attending: Emergency Medicine | Admitting: Emergency Medicine

## 2014-03-31 ENCOUNTER — Emergency Department (HOSPITAL_COMMUNITY): Payer: Medicaid Other

## 2014-03-31 DIAGNOSIS — Z720 Tobacco use: Secondary | ICD-10-CM | POA: Insufficient documentation

## 2014-03-31 DIAGNOSIS — Y9389 Activity, other specified: Secondary | ICD-10-CM | POA: Diagnosis not present

## 2014-03-31 DIAGNOSIS — Y9289 Other specified places as the place of occurrence of the external cause: Secondary | ICD-10-CM | POA: Insufficient documentation

## 2014-03-31 DIAGNOSIS — M1990 Unspecified osteoarthritis, unspecified site: Secondary | ICD-10-CM | POA: Diagnosis not present

## 2014-03-31 DIAGNOSIS — W11XXXA Fall on and from ladder, initial encounter: Secondary | ICD-10-CM | POA: Diagnosis not present

## 2014-03-31 DIAGNOSIS — S6992XA Unspecified injury of left wrist, hand and finger(s), initial encounter: Secondary | ICD-10-CM | POA: Diagnosis present

## 2014-03-31 DIAGNOSIS — Z791 Long term (current) use of non-steroidal anti-inflammatories (NSAID): Secondary | ICD-10-CM | POA: Insufficient documentation

## 2014-03-31 DIAGNOSIS — Y998 Other external cause status: Secondary | ICD-10-CM | POA: Insufficient documentation

## 2014-03-31 DIAGNOSIS — S339XXA Sprain of unspecified parts of lumbar spine and pelvis, initial encounter: Secondary | ICD-10-CM | POA: Insufficient documentation

## 2014-03-31 DIAGNOSIS — S335XXA Sprain of ligaments of lumbar spine, initial encounter: Secondary | ICD-10-CM

## 2014-03-31 DIAGNOSIS — Z79899 Other long term (current) drug therapy: Secondary | ICD-10-CM | POA: Diagnosis not present

## 2014-03-31 DIAGNOSIS — S62353A Nondisplaced fracture of shaft of third metacarpal bone, left hand, initial encounter for closed fracture: Secondary | ICD-10-CM

## 2014-03-31 DIAGNOSIS — S63502A Unspecified sprain of left wrist, initial encounter: Secondary | ICD-10-CM

## 2014-03-31 MED ORDER — OXYCODONE-ACETAMINOPHEN 5-325 MG PO TABS
2.0000 | ORAL_TABLET | Freq: Once | ORAL | Status: AC
  Administered 2014-03-31: 2 via ORAL
  Filled 2014-03-31: qty 2

## 2014-03-31 MED ORDER — OXYCODONE-ACETAMINOPHEN 5-325 MG PO TABS
1.0000 | ORAL_TABLET | Freq: Once | ORAL | Status: DC
  Filled 2014-03-31: qty 1

## 2014-03-31 MED ORDER — OXYCODONE-ACETAMINOPHEN 5-325 MG PO TABS: 2.0000 | ORAL_TABLET | ORAL | Status: AC | PRN

## 2014-03-31 NOTE — ED Notes (Signed)
Pt standing on a chair in order to hang blinds. Fell landing on left hand.  Pt c/o left hand and wrist pain.  Pt also had back surgery in January.  Pt states pain in lower back.  Denies LOC/head injury. Denies numbness/tingling down either arm.

## 2014-03-31 NOTE — ED Provider Notes (Signed)
CSN: [id number]     Arrival date & time 03/31/14  0815 History   First MD Initiated Contact with Patient 03/31/14 [phone]     Chief Complaint  Patient presents with  . Hand Injury  . Back Pain     Patient is a 49 y.o. male presenting with hand injury and back pain. The history is provided by the patient.  Hand Injury Location:  Wrist and hand Time since incident: just prior to arrival. Wrist location:  L wrist Hand location:  L hand Pain details:    Quality:  Aching   Radiates to:  Does not radiate   Severity:  Severe   Onset quality:  Sudden   Timing:  Constant   Progression:  Worsening Chronicity:  New Relieved by:  Nothing Worsened by:  Movement Associated symptoms: back pain   Associated symptoms: no fever, no muscle weakness and no neck pain   Back Pain Location:  Lumbar spine Quality:  Aching Radiates to:  Does not radiate Pain severity:  Moderate Timing:  Constant Progression:  Worsening Chronicity:  Recurrent Context: falling   Relieved by:  Being still Worsened by:  Movement Associated symptoms: no abdominal pain, no chest pain, no fever, no headaches, no numbness and no weakness   Patient is s/p fall He fell from step ladder while trying to hang blinds He fell from about 5 steps No head injury No LOC He reports he fell on his left arm causing pain in left wrist/hand He also injured his low back in the fall No neck pain No arm/leg weakness is reported  He is s/p lumbar microdiscectomy on 02/19/14  Past Medical History  Diagnosis Date  . Arthritis     right shoulder  . Medical history non-contributory     has never had any surgeries   Past Surgical History  Procedure Laterality Date  . Lasik  2006     unsure of which eye, likely L eye   . Eye surgery      left eye  -- "white dots in his eye"  . Lumbar laminectomy/decompression microdiscectomy Left 02/19/2014    Procedure: Left Lumbar four-five microdiskectomy;  Surgeon: Lisbeth Renshaw, MD;   Location: [facility];  Service: Neurosurgery;  Laterality: Left;  Left Lumbar four-five microdiskectomy   Family History  Problem Relation Age of Onset  . Cancer Mother     breast   . Kidney disease Mother   . Diabetes Brother   . Heart disease Neg Hx   . Hypertension Neg Hx    History  Substance Use Topics  . Smoking status: Current Some Day Smoker -- 0.50 packs/day for 25 years    Types: Cigarettes  . Smokeless tobacco: Former Neurosurgeon  . Alcohol Use: No    Review of Systems  Constitutional: Negative for fever.  Cardiovascular: Negative for chest pain.  Gastrointestinal: Negative for abdominal pain.  Musculoskeletal: Positive for back pain and arthralgias. Negative for neck pain.  Neurological: Negative for weakness, numbness and headaches.  All other systems reviewed and are negative.     Allergies  Gabapentin  Home Medications   Prior to Admission medications   Medication Sig Start Date End Date Taking? Authorizing Provider  cyanocobalamin 500 MCG tablet Take 2 tablets (1,000 mcg total) by mouth daily. 01/24/14  Yes Quentin Angst, MD  cyclobenzaprine (FLEXERIL) 10 MG tablet Take 1 tablet (10 mg total) by mouth 3 times/day as needed-between meals & bedtime for muscle spasms. 01/08/14  Yes Josalyn  C Funches, MD  diazepam (VALIUM) 5 MG tablet Take 1 tablet (5 mg total) by mouth every 6 (six) hours as needed for anxiety. 02/19/14  Yes Lisbeth Renshaw, MD  oxyCODONE-acetaminophen (PERCOCET) 10-325 MG per tablet Take 1 tablet by mouth every 4 (four) hours as needed for pain. 02/19/14  Yes Lisbeth Renshaw, MD  gabapentin (NEURONTIN) 300 MG capsule Take 1 capsule (300 mg total) by mouth 3 (three) times daily. Patient not taking: Reported on 03/31/2014 01/08/14   Lora Paula, MD  naproxen (NAPROSYN) 500 MG tablet Take 1 tablet (500 mg total) by mouth 2 (two) times daily with a meal. 01/11/14   Josalyn C Funches, MD   BP 108/61 mmHg  Pulse 80  Temp(Src) 98.3 F (36.8  C) (Oral)  Resp 22  SpO2 99% Physical Exam CONSTITUTIONAL: Well developed/well nourished HEAD: Normocephalic/atraumatic EYES: EOMI/PERRL ENMT: Mucous membranes moist NECK: supple no meningeal signs SPINE/BACK:no cervical/thoracic tenderness.  Tenderness to lumbar spine.  Well healed incision noted.  There is soft tissue swelling without bruising CV: S1/S2 noted, no murmurs/rubs/gallops noted LUNGS: Lungs are clear to auscultation bilaterally, no apparent distress ABDOMEN: soft, nontender, no rebound or guarding, bowel sounds noted throughout abdomen GU:no cva tenderness NEURO: Pt is awake/alert/appropriate, moves all extremitiesx4.  No facial droop.  He can fully range both legs without any difficulty.  Equal power in bilateral LE EXTREMITIES: pulses normal/equal, full ROM. Tenderness/swelling to left wrist/hand He can range left elbow/shoulder without tenderness.  All other extremities/joints palpated/ranged and nontender SKIN: warm, color normal PSYCH: anxious  ED Course  Procedures  9:26 AM Pt would prefer oral pain meds at this time 12:14 PM Pt improved He is ambulatory He reports his BP is similar to the pain he has everday Appropriate for d/c home    SPLINT APPLICATION Date/Time: 03/31/14 Authorized by: Joya Gaskins Consent: Verbal consent obtained. Risks and benefits: risks, benefits and alternatives were discussed Consent given by: patient Splint applied by: orthopedic technician Location details: left hand Splint type: volar splint Supplies used: fiberglass Post-procedure: The splinted body part was neurovascularly unchanged following the procedure. Patient tolerance: Patient tolerated the procedure well with no immediate complications.    Imaging Review Dg Lumbar Spine Complete  03/31/2014   CLINICAL DATA:  49 year old male status post fall from chair with lumbar spine pain  EXAM: LUMBAR SPINE - COMPLETE 4+ VIEW  COMPARISON:  Prior intraoperative  radiographs of the lumbar spine 02/19/2014; Prior MRI lumbar spine 01/11/2014  FINDINGS: No evidence of acute fracture or malalignment. Mild disc space narrowing at L4-L5 again noted without interval progression. Vertebral body heights are maintained. No lytic or blastic osseous lesion. Unremarkable visualized bowel gas pattern.  IMPRESSION: Negative.   Electronically Signed   By: Malachy Moan M.D.   On: 03/31/2014 09:48   Dg Wrist Complete Left  03/31/2014   CLINICAL DATA:  Acute left wrist pain after fall yesterday. Initial encounter.  EXAM: LEFT WRIST - COMPLETE 3+ VIEW  COMPARISON:  None.  FINDINGS: Nondisplaced oblique fracture seen involving the proximal portion of the third metacarpal. This appears to be closed and posttraumatic. Joint spaces are intact. No soft tissue abnormality is noted.  IMPRESSION: Nondisplaced oblique fracture involving the proximal third metacarpal.   Electronically Signed   By: Lupita Raider, M.D.   On: 03/31/2014 09:46   Dg Hand Complete Left  03/31/2014   CLINICAL DATA:  49 year old male status post fall from chair. Unable to extend fingers for imaging.  EXAM: LEFT HAND -  COMPLETE 3+ VIEW  COMPARISON:  Concurrently obtained radiographs of the left wrist  FINDINGS: No evidence of acute fracture or malalignment. There is a lucency with well corticated margins at the distal tuft of the ring finger suggesting a remote distal tuft fracture. Normal bony mineralization. No lytic or blastic osseous lesion. No evidence of inflammatory arthropathy.  IMPRESSION: No acute osseous abnormality.   Electronically Signed   By: Malachy MoanHeath  McCullough M.D.   On: 03/31/2014 09:43     Medications  oxyCODONE-acetaminophen (PERCOCET/ROXICET) 5-325 MG per tablet 2 tablet (2 tablets Oral Given 03/31/14 0919)    MDM   Final diagnoses:  Closed nondisplaced fracture of shaft of third metacarpal bone of left hand, initial encounter  Sprain of left wrist, initial encounter  Sprain of lumbar  spine, initial encounter    Nursing notes including past medical history and social history reviewed and considered in documentation xrays/imaging reviewed by myself and considered during evaluation Previous records reviewed and considered     Joya Gaskinsonald W Audelia Knape, MD 03/31/14 1215

## 2014-03-31 NOTE — Discharge Instructions (Signed)

## 2014-08-02 ENCOUNTER — Emergency Department (HOSPITAL_COMMUNITY): Payer: Medicaid Other

## 2014-08-02 ENCOUNTER — Emergency Department (HOSPITAL_COMMUNITY)
Admission: EM | Admit: 2014-08-02 | Discharge: 2014-08-02 | Disposition: A | Discharge status: Home / Self Care | Payer: Medicaid Other | Attending: Emergency Medicine | Admitting: Emergency Medicine

## 2014-08-02 ENCOUNTER — Encounter (HOSPITAL_COMMUNITY): Payer: Self-pay | Admitting: Emergency Medicine

## 2014-08-02 DIAGNOSIS — Z720 Tobacco use: Secondary | ICD-10-CM | POA: Diagnosis not present

## 2014-08-02 DIAGNOSIS — Z79899 Other long term (current) drug therapy: Secondary | ICD-10-CM | POA: Insufficient documentation

## 2014-08-02 DIAGNOSIS — Z791 Long term (current) use of non-steroidal anti-inflammatories (NSAID): Secondary | ICD-10-CM | POA: Diagnosis not present

## 2014-08-02 DIAGNOSIS — M19011 Primary osteoarthritis, right shoulder: Secondary | ICD-10-CM | POA: Insufficient documentation

## 2014-08-02 DIAGNOSIS — M7989 Other specified soft tissue disorders: Secondary | ICD-10-CM | POA: Insufficient documentation

## 2014-08-02 DIAGNOSIS — M79642 Pain in left hand: Secondary | ICD-10-CM | POA: Insufficient documentation

## 2014-08-02 MED ORDER — NAPROXEN 500 MG PO TABS: 500.0000 mg | ORAL_TABLET | Freq: Two times a day (BID) | ORAL | Status: DC

## 2014-08-02 NOTE — ED Provider Notes (Signed)
CSN: [id number]     Arrival date & time 08/02/14  0746 History   First MD Initiated Contact with Patient 08/02/14 [phone]     Chief Complaint  Patient presents with  . Hand Pain     (Consider location/radiation/quality/duration/timing/severity/associated sxs/prior Treatment) Patient is a 49 y.o. male presenting with hand pain. The history is provided by the patient. No language interpreter was used.  Hand Pain This is a new problem. The current episode started 1 to 4 weeks ago. The problem occurs constantly. The problem has been unchanged. The symptoms are aggravated by bending. He has tried nothing for the symptoms.    Past Medical History  Diagnosis Date  . Arthritis     right shoulder  . Medical history non-contributory     has never had any surgeries   Past Surgical History  Procedure Laterality Date  . Lasik  2006     unsure of which eye, likely L eye   . Eye surgery      left eye  -- "white dots in his eye"  . Lumbar laminectomy/decompression microdiscectomy Left 02/19/2014    Procedure: Left Lumbar four-five microdiskectomy;  Surgeon: Lisbeth Renshaw, MD;  Location: [facility];  Service: Neurosurgery;  Laterality: Left;  Left Lumbar four-five microdiskectomy   Family History  Problem Relation Age of Onset  . Cancer Mother     breast   . Kidney disease Mother   . Diabetes Brother   . Heart disease Neg Hx   . Hypertension Neg Hx    History  Substance Use Topics  . Smoking status: Current Some Day Smoker -- 0.50 packs/day for 25 years    Types: Cigarettes  . Smokeless tobacco: Former Neurosurgeon  . Alcohol Use: No    Review of Systems  All other systems reviewed and are negative.     Allergies  Gabapentin  Home Medications   Prior to Admission medications   Medication Sig Start Date End Date Taking? Authorizing Provider  cyanocobalamin 500 MCG tablet Take 2 tablets (1,000 mcg total) by mouth daily. 01/24/14   Quentin Angst, MD  cyclobenzaprine  (FLEXERIL) 10 MG tablet Take 1 tablet (10 mg total) by mouth 3 times/day as needed-between meals & bedtime for muscle spasms. 01/08/14   Josalyn Funches, MD  diazepam (VALIUM) 5 MG tablet Take 1 tablet (5 mg total) by mouth every 6 (six) hours as needed for anxiety. 02/19/14   Lisbeth Renshaw, MD  naproxen (NAPROSYN) 500 MG tablet Take 1 tablet (500 mg total) by mouth 2 (two) times daily with a meal. 01/11/14   Dessa Phi, MD  oxyCODONE-acetaminophen (PERCOCET/ROXICET) 5-325 MG per tablet Take 2 tablets by mouth every 4 (four) hours as needed for severe pain. 03/31/14   Zadie Rhine, MD   BP 125/76 mmHg  Pulse 57  Temp(Src) 98.3 F (36.8 C) (Oral)  Resp 16  SpO2 100% Physical Exam  Constitutional: He appears well-developed and well-nourished.  Cardiovascular: Normal rate and regular rhythm.   Pulmonary/Chest: Effort normal and breath sounds normal.  Musculoskeletal:  Swelling noted to the left hand along the middle knuckle  Nursing note and vitals reviewed.   ED Course  Procedures (including critical care time) Labs Review Labs Reviewed - No data to display  Imaging Review Dg Hand Complete Left  08/02/2014   CLINICAL DATA:  Pain across the metacarpals. No recent injury. Initial encounter.  EXAM: LEFT HAND - COMPLETE 3+ VIEW  COMPARISON:  Radiographs 03/31/2014.  FINDINGS: There is posttraumatic  deformity of the base of the third metacarpal consistent with a healed fracture. There is stable posttraumatic deformity of the distal fourth phalanx. No acute fracture, dislocation or foreign body demonstrated.  IMPRESSION: Posttraumatic deformities as described with healed fracture of the third metacarpal base. No acute osseous findings.   Electronically Signed   By: Carey BullocksWilliam  Veazey M.D.   On: 08/02/2014 08:15     EKG Interpretation None      MDM   Final diagnoses:  Pain of left hand    No acute injury noted. Hand is neurovascularly intact    Teressa LowerVrinda Melenie Minniear, NP 08/02/14  [phone]  Donnetta HutchingBrian Cook, MD 08/02/14 [phone]

## 2014-08-02 NOTE — Discharge Instructions (Signed)

## 2014-08-02 NOTE — ED Notes (Signed)
Patient states L hand injury x 2 months ago.   Patient states "they told me I cracked my hand, but didn't put a cast on or nothing".

## 2014-08-20 ENCOUNTER — Encounter (HOSPITAL_COMMUNITY): Payer: Self-pay | Admitting: *Deleted

## 2014-08-20 ENCOUNTER — Emergency Department (HOSPITAL_COMMUNITY)
Admission: EM | Admit: 2014-08-20 | Discharge: 2014-08-20 | Disposition: A | Discharge status: Home / Self Care | Payer: Medicaid Other | Attending: Emergency Medicine | Admitting: Emergency Medicine

## 2014-08-20 DIAGNOSIS — Z79899 Other long term (current) drug therapy: Secondary | ICD-10-CM | POA: Insufficient documentation

## 2014-08-20 DIAGNOSIS — M5442 Lumbago with sciatica, left side: Secondary | ICD-10-CM | POA: Insufficient documentation

## 2014-08-20 DIAGNOSIS — R1030 Lower abdominal pain, unspecified: Secondary | ICD-10-CM | POA: Insufficient documentation

## 2014-08-20 DIAGNOSIS — M545 Low back pain: Secondary | ICD-10-CM | POA: Diagnosis present

## 2014-08-20 DIAGNOSIS — M19011 Primary osteoarthritis, right shoulder: Secondary | ICD-10-CM | POA: Diagnosis not present

## 2014-08-20 DIAGNOSIS — Z720 Tobacco use: Secondary | ICD-10-CM | POA: Diagnosis not present

## 2014-08-20 LAB — URINALYSIS, ROUTINE W REFLEX MICROSCOPIC
GLUCOSE, UA: NEGATIVE mg/dL
Hgb urine dipstick: NEGATIVE
Ketones, ur: NEGATIVE mg/dL
Leukocytes, UA: NEGATIVE
Nitrite: NEGATIVE
Protein, ur: NEGATIVE mg/dL
SPECIFIC GRAVITY, URINE: 1.04 — AB (ref 1.005–1.030)
UROBILINOGEN UA: 1 mg/dL (ref 0.0–1.0)
pH: 5.5 (ref 5.0–8.0)

## 2014-08-20 MED ORDER — OXYCODONE-ACETAMINOPHEN 5-325 MG PO TABS: 1.0000 | ORAL_TABLET | Freq: Four times a day (QID) | ORAL | Status: AC | PRN

## 2014-08-20 MED ORDER — DIAZEPAM 5 MG PO TABS
5.0000 mg | ORAL_TABLET | Freq: Once | ORAL | Status: AC
  Administered 2014-08-20: 5 mg via ORAL
  Filled 2014-08-20: qty 1

## 2014-08-20 MED ORDER — DIAZEPAM 5 MG PO TABS: 5.0000 mg | ORAL_TABLET | Freq: Three times a day (TID) | ORAL | Status: AC | PRN

## 2014-08-20 MED ORDER — OXYCODONE-ACETAMINOPHEN 5-325 MG PO TABS
2.0000 | ORAL_TABLET | Freq: Once | ORAL | Status: AC
  Administered 2014-08-20: 2 via ORAL
  Filled 2014-08-20: qty 2

## 2014-08-20 NOTE — ED Provider Notes (Signed)
CSN: [id number]     Arrival date & time 08/20/14  1837 History   First MD Initiated Contact with Patient 08/20/14 1937     Chief Complaint  Patient presents with  . Back Pain  . Groin Pain     (Consider location/radiation/quality/duration/timing/severity/associated sxs/prior Treatment) Patient is a 49 y.o. male presenting with back pain and groin pain. The history is provided by the patient.  Back Pain Location:  Lumbar spine Quality:  Aching Radiates to:  L posterior upper leg (Groin) Pain severity:  Moderate Onset quality:  Gradual Duration:  1 month Timing:  Constant Progression:  Worsening Chronicity:  Recurrent (Feels similar to prior pain he had associated with his back surgery) Context comment:  Prior lumbar surgery Relieved by:  Being still Worsened by:  Movement Associated symptoms: no abdominal pain and no fever   Groin Pain Pertinent negatives include no abdominal pain and no shortness of breath.    Past Medical History  Diagnosis Date  . Arthritis     right shoulder  . Medical history non-contributory     has never had any surgeries   Past Surgical History  Procedure Laterality Date  . Lasik  2006     unsure of which eye, likely L eye   . Eye surgery      left eye  -- "white dots in his eye"  . Lumbar laminectomy/decompression microdiscectomy Left 02/19/2014    Procedure: Left Lumbar four-five microdiskectomy;  Surgeon: Lisbeth Renshaw, MD;  Location: [facility];  Service: Neurosurgery;  Laterality: Left;  Left Lumbar four-five microdiskectomy   Family History  Problem Relation Age of Onset  . Cancer Mother     breast   . Kidney disease Mother   . Diabetes Brother   . Heart disease Neg Hx   . Hypertension Neg Hx    History  Substance Use Topics  . Smoking status: Current Some Day Smoker -- 0.50 packs/day for 25 years    Types: Cigarettes  . Smokeless tobacco: Former Neurosurgeon  . Alcohol Use: No    Review of Systems  Constitutional: Negative  for fever.  Respiratory: Negative for cough and shortness of breath.   Gastrointestinal: Negative for vomiting and abdominal pain.  Musculoskeletal: Positive for back pain.  All other systems reviewed and are negative.     Allergies  Gabapentin  Home Medications   Prior to Admission medications   Medication Sig Start Date End Date Taking? Authorizing Provider  acetaminophen (TYLENOL) 325 MG tablet Take 650 mg by mouth every 6 (six) hours as needed for moderate pain.   Yes Historical Provider, MD  cyanocobalamin 500 MCG tablet Take 2 tablets (1,000 mcg total) by mouth daily. 01/24/14  Yes Quentin Angst, MD  cyclobenzaprine (FLEXERIL) 10 MG tablet Take 1 tablet (10 mg total) by mouth 3 times/day as needed-between meals & bedtime for muscle spasms. Patient not taking: Reported on 08/20/2014 01/08/14   Dessa Phi, MD  diazepam (VALIUM) 5 MG tablet Take 1 tablet (5 mg total) by mouth every 6 (six) hours as needed for anxiety. Patient not taking: Reported on 08/20/2014 02/19/14   Lisbeth Renshaw, MD  diazepam (VALIUM) 5 MG tablet Take 1 tablet (5 mg total) by mouth every 8 (eight) hours as needed for muscle spasms. 08/20/14   Elwin Mocha, MD  naproxen (NAPROSYN) 500 MG tablet Take 1 tablet (500 mg total) by mouth 2 (two) times daily. Patient not taking: Reported on 08/20/2014 08/02/14   Teressa Lower, NP  oxyCODONE-acetaminophen (PERCOCET/ROXICET) 5-325 MG per tablet Take 2 tablets by mouth every 4 (four) hours as needed for severe pain. Patient not taking: Reported on 08/20/2014 03/31/14   Zadie Rhineonald Wickline, MD  oxyCODONE-acetaminophen (PERCOCET/ROXICET) 5-325 MG per tablet Take 1 tablet by mouth every 6 (six) hours as needed for severe pain. 08/20/14   Elwin MochaBlair Maleiyah Releford, MD   BP 112/68 mmHg  Pulse 60  Temp(Src) 99.4 F (37.4 C) (Oral)  Resp 12  SpO2 97% Physical Exam  Constitutional: He is oriented to person, place, and time. He appears well-developed and well-nourished. No distress.    HENT:  Head: Normocephalic and atraumatic.  Mouth/Throat: Oropharynx is clear and moist. No oropharyngeal exudate.  Eyes: EOM are normal. Pupils are equal, round, and reactive to light.  Neck: Normal range of motion. Neck supple.  Cardiovascular: Normal rate and regular rhythm.  Exam reveals no friction rub.   No murmur heard. Pulmonary/Chest: Effort normal and breath sounds normal. No respiratory distress. He has no wheezes. He has no rales.  Abdominal: Soft. He exhibits no distension. There is tenderness in the suprapubic area. There is no rebound.  Musculoskeletal: He exhibits no edema.       Cervical back: He exhibits no tenderness and no bony tenderness.       Thoracic back: He exhibits no tenderness and no bony tenderness.       Lumbar back: He exhibits decreased range of motion and tenderness (L lower lumbar area).       Back:  Neurological: He is alert and oriented to person, place, and time.  Skin: No rash noted. He is not diaphoretic.  Nursing note and vitals reviewed.   ED Course  Procedures (including critical care time) Labs Review Labs Reviewed  URINALYSIS, ROUTINE W REFLEX MICROSCOPIC (NOT [facility]) - Abnormal; Notable for the following:    Color, Urine AMBER (*)    Specific Gravity, Urine 1.040 (*)    Bilirubin Urine SMALL (*)    All other components within normal limits    Imaging Review No results found.   EKG Interpretation None      MDM   Final diagnoses:  Low back pain with left-sided sciatica, unspecified back pain laterality    49 year old male here with left lower back pain. Radiates around into his groin and down into his leg. Similar to prior back Boyd Kerbsenny has had lumbar surgery several months ago. Present for a month, worse over the past week. Denies any urinary or fecal incontinence or retention. No fevers.  No spasm appreciated in his lower back. He has a lot of suprapubic tenderness. Urinalysis is normal. Lower back pain resolved with  Percocet and Valium. Stable for discharge.   Elwin MochaBlair Marquin Patino, MD 08/20/14 [phone]

## 2014-08-20 NOTE — ED Notes (Addendum)
Pt complains of lower back pain radiating to his left leg. Pt had back surgery February, states the pain became worse 1 month ago and even more severe 1 week ago. Pt states the pain is worse when he breathes, traveling from his groin up to his naval. Pt had been taking tylenol without relief.

## 2014-08-20 NOTE — Discharge Instructions (Signed)
Sciatica Sciatica is pain, weakness, numbness, or tingling along the path of the sciatic nerve. The nerve starts in the lower back and runs down the back of each leg. The nerve controls the muscles in the lower leg and in the back of the knee, while also providing sensation to the back of the thigh, lower leg, and the sole of your foot. Sciatica is a symptom of another medical condition. For instance, nerve damage or certain conditions, such as a herniated disk or bone spur on the spine, pinch or put pressure on the sciatic nerve. This causes the pain, weakness, or other sensations normally associated with sciatica. Generally, sciatica only affects one side of the body. CAUSES   Herniated or slipped disc.  Degenerative disk disease.  A pain disorder involving the narrow muscle in the buttocks (piriformis syndrome).  Pelvic injury or fracture.  Pregnancy.  Tumor (rare). SYMPTOMS  Symptoms can vary from mild to very severe. The symptoms usually travel from the low back to the buttocks and down the back of the leg. Symptoms can include:  Mild tingling or dull aches in the lower back, leg, or hip.  Numbness in the back of the calf or sole of the foot.  Burning sensations in the lower back, leg, or hip.  Sharp pains in the lower back, leg, or hip.  Leg weakness.  Severe back pain inhibiting movement. These symptoms may get worse with coughing, sneezing, laughing, or prolonged sitting or standing. Also, being overweight may worsen symptoms. DIAGNOSIS  Your caregiver will perform a physical exam to look for common symptoms of sciatica. He or she may ask you to do certain movements or activities that would trigger sciatic nerve pain. Other tests may be performed to find the cause of the sciatica. These may include:  Blood tests.  X-rays.  Imaging tests, such as an MRI or CT scan. TREATMENT  Treatment is directed at the cause of the sciatic pain. Sometimes, treatment is not necessary  and the pain and discomfort goes away on its own. If treatment is needed, your caregiver may suggest:  Over-the-counter medicines to relieve pain.  Prescription medicines, such as anti-inflammatory medicine, muscle relaxants, or narcotics.  Applying heat or ice to the painful area.  Steroid injections to lessen pain, irritation, and inflammation around the nerve.  Reducing activity during periods of pain.  Exercising and stretching to strengthen your abdomen and improve flexibility of your spine. Your caregiver may suggest losing weight if the extra weight makes the back pain worse.  Physical therapy.  Surgery to eliminate what is pressing or pinching the nerve, such as a bone spur or part of a herniated disk. HOME CARE INSTRUCTIONS   Only take over-the-counter or prescription medicines for pain or discomfort as directed by your caregiver.  Apply ice to the affected area for 20 minutes, 3-4 times a day for the first 48-72 hours. Then try heat in the same way.  Exercise, stretch, or perform your usual activities if these do not aggravate your pain.  Attend physical therapy sessions as directed by your caregiver.  Keep all follow-up appointments as directed by your caregiver.  Do not wear high heels or shoes that do not provide proper support.  Check your mattress to see if it is too soft. A firm mattress may lessen your pain and discomfort. SEEK IMMEDIATE MEDICAL CARE IF:   You lose control of your bowel or bladder (incontinence).  You have increasing weakness in the lower back, pelvis, buttocks,   or legs.  You have redness or swelling of your back.  You have a burning sensation when you urinate.  You have pain that gets worse when you lie down or awakens you at night.  Your pain is worse than you have experienced in the past.  Your pain is lasting longer than 4 weeks.  You are suddenly losing weight without reason. MAKE SURE YOU:  Understand these  instructions.  Will watch your condition.  Will get help right away if you are not doing well or get worse. Document Released: 01/12/2001 Document Revised: 07/20/2011 Document Reviewed: 05/30/2011 [employer] Patient Information 2015 [employer]. This information is not intended to replace advice given to you by your health care provider. Make sure you discuss any questions you have with your health care provider.  

## 2014-08-20 NOTE — ED Notes (Signed)
Pt reports weight loss > 15 lbs in the past 4-5 months with no decreased in diet/appetite. Pt also states that the pain is simular to pain experienced prior to his surgery back in Feb except now he is having pain in testicles as well as navel.

## 2014-08-20 NOTE — ED Notes (Signed)
Questions, concerns r/t dc were denied. Pt ambulatory but will use wheelchair for transport

## 2014-09-02 ENCOUNTER — Encounter (HOSPITAL_COMMUNITY): Payer: Self-pay

## 2014-09-02 ENCOUNTER — Emergency Department (HOSPITAL_COMMUNITY)
Admission: EM | Admit: 2014-09-02 | Discharge: 2014-09-02 | Disposition: A | Discharge status: Home / Self Care | Payer: Medicaid Other | Attending: Emergency Medicine | Admitting: Emergency Medicine

## 2014-09-02 DIAGNOSIS — Z720 Tobacco use: Secondary | ICD-10-CM | POA: Insufficient documentation

## 2014-09-02 DIAGNOSIS — M549 Dorsalgia, unspecified: Secondary | ICD-10-CM

## 2014-09-02 DIAGNOSIS — G8929 Other chronic pain: Secondary | ICD-10-CM | POA: Diagnosis not present

## 2014-09-02 DIAGNOSIS — Z79899 Other long term (current) drug therapy: Secondary | ICD-10-CM | POA: Insufficient documentation

## 2014-09-02 DIAGNOSIS — M545 Low back pain: Secondary | ICD-10-CM | POA: Diagnosis present

## 2014-09-02 DIAGNOSIS — M1990 Unspecified osteoarthritis, unspecified site: Secondary | ICD-10-CM | POA: Diagnosis not present

## 2014-09-02 MED ORDER — IBUPROFEN 800 MG PO TABS: 800.0000 mg | ORAL_TABLET | Freq: Three times a day (TID) | ORAL | Status: AC

## 2014-09-02 MED ORDER — IBUPROFEN 800 MG PO TABS
800.0000 mg | ORAL_TABLET | Freq: Once | ORAL | Status: AC
  Administered 2014-09-02: 800 mg via ORAL
  Filled 2014-09-02: qty 1

## 2014-09-02 NOTE — ED Notes (Addendum)
Pt reports chronic lower back pain since June.  Pt reports the pain was a shooting pain down the lower back and into his leg.  Pt also reports bilateral groin pain.

## 2014-09-02 NOTE — Discharge Instructions (Signed)
Chronic Back Pain Contact your primary care physician today for follow-up regarding chronic back pain. Take the ibuprofen prescribed as directed for pain and inflammation  When back pain lasts longer than 3 months, it is called chronic back pain.People with chronic back pain often go through certain periods that are more intense (flare-ups).  CAUSES Chronic back pain can be caused by wear and tear (degeneration) on different structures in your back. These structures include:  The bones of your spine (vertebrae) and the joints surrounding your spinal cord and nerve roots (facets).  The strong, fibrous tissues that connect your vertebrae (ligaments). Degeneration of these structures may result in pressure on your nerves. This can lead to constant pain. HOME CARE INSTRUCTIONS  Avoid bending, heavy lifting, prolonged sitting, and activities which make the problem worse.  Take brief periods of rest throughout the day to reduce your pain. Lying down or standing usually is better than sitting while you are resting.  Take over-the-counter or prescription medicines only as directed by your caregiver. SEEK IMMEDIATE MEDICAL CARE IF:   You have weakness or numbness in one of your legs or feet.  You have trouble controlling your bladder or bowels.  You have nausea, vomiting, abdominal pain, shortness of breath, or fainting. Document Released: 02/26/2004 Document Revised: 04/12/2011 Document Reviewed: 01/02/2011 [facility] Patient Information 2015 [place], [place]. This information is not intended to replace advice given to you by your health care provider. Make sure you discuss any questions you have with your health care provider.

## 2014-09-02 NOTE — ED Provider Notes (Signed)
CSN: [id number]     Arrival date & time 09/02/14  0749 History   First MD Initiated Contact with Patient 09/02/14 [phone]     Chief Complaint  Patient presents with  . Back Pain     (Consider location/radiation/quality/duration/timing/severity/associated sxs/prior Treatment) HPI Complains of low  back pain onset June 2016 radiates to left mid calf. Pain is worse with flexing of the waste improved with draining still. He is treated himself with Tylenol and with Percocet without adequate relief. He's run out of Percocet. No other associated symptoms. No loss of bladder or bowel control no fever no new injury Past Medical History  Diagnosis Date  . Arthritis     right shoulder  . Medical history non-contributory     has never had any surgeries   Past Surgical History  Procedure Laterality Date  . Lasik  2006     unsure of which eye, likely L eye   . Eye surgery      left eye  -- "white dots in his eye"  . Lumbar laminectomy/decompression microdiscectomy Left 02/19/2014    Procedure: Left Lumbar four-five microdiskectomy;  Surgeon: Lisbeth Renshaw, MD;  Location: [facility];  Service: Neurosurgery;  Laterality: Left;  Left Lumbar four-five microdiskectomy   Family History  Problem Relation Age of Onset  . Cancer Mother     breast   . Kidney disease Mother   . Diabetes Brother   . Heart disease Neg Hx   . Hypertension Neg Hx    History  Substance Use Topics  . Smoking status: Current Some Day Smoker -- 0.50 packs/day for 25 years    Types: Cigarettes  . Smokeless tobacco: Former Neurosurgeon  . Alcohol Use: No    Review of Systems  Constitutional: Negative.   HENT: Negative.   Respiratory: Negative.   Cardiovascular: Negative.   Gastrointestinal: Negative.   Musculoskeletal: Positive for back pain.  Skin: Negative.   Neurological: Negative.   Psychiatric/Behavioral: Negative.   All other systems reviewed and are negative.     Allergies  Gabapentin  Home Medications    Prior to Admission medications   Medication Sig Start Date End Date Taking? Authorizing Provider  acetaminophen (TYLENOL) 325 MG tablet Take 650 mg by mouth every 6 (six) hours as needed for moderate pain.    Historical Provider, MD  cyanocobalamin 500 MCG tablet Take 2 tablets (1,000 mcg total) by mouth daily. 01/24/14   Quentin Angst, MD  cyclobenzaprine (FLEXERIL) 10 MG tablet Take 1 tablet (10 mg total) by mouth 3 times/day as needed-between meals & bedtime for muscle spasms. Patient not taking: Reported on 08/20/2014 01/08/14   Dessa Phi, MD  diazepam (VALIUM) 5 MG tablet Take 1 tablet (5 mg total) by mouth every 6 (six) hours as needed for anxiety. Patient not taking: Reported on 08/20/2014 02/19/14   Lisbeth Renshaw, MD  diazepam (VALIUM) 5 MG tablet Take 1 tablet (5 mg total) by mouth every 8 (eight) hours as needed for muscle spasms. 08/20/14   Elwin Mocha, MD  naproxen (NAPROSYN) 500 MG tablet Take 1 tablet (500 mg total) by mouth 2 (two) times daily. Patient not taking: Reported on 08/20/2014 08/02/14   Teressa Lower, NP  oxyCODONE-acetaminophen (PERCOCET/ROXICET) 5-325 MG per tablet Take 2 tablets by mouth every 4 (four) hours as needed for severe pain. Patient not taking: Reported on 08/20/2014 03/31/14   Zadie Rhine, MD  oxyCODONE-acetaminophen (PERCOCET/ROXICET) 5-325 MG per tablet Take 1 tablet by mouth every 6 (six)  hours as needed for severe pain. 08/20/14   Elwin Mocha, MD   BP 117/73 mmHg  Pulse 88  Temp(Src) 98 F (36.7 C) (Oral)  Resp 18  Ht 6' (1.829 m)  Wt 165 lb (74.844 kg)  BMI 22.37 kg/m2  SpO2 98% Physical Exam  Constitutional: He is oriented to person, place, and time. He appears well-developed and well-nourished. No distress.  HENT:  Head: Normocephalic and atraumatic.  Eyes: Conjunctivae are normal. Pupils are equal, round, and reactive to light.  Neck: Neck supple. No tracheal deviation present. No thyromegaly present.  Cardiovascular:  Normal rate and regular rhythm.   No murmur heard. Pulmonary/Chest: Effort normal and breath sounds normal.  Abdominal: Soft. Bowel sounds are normal. He exhibits no distension. There is no tenderness.  Musculoskeletal: Normal range of motion. He exhibits no edema or tenderness.  No tenderness along spine. Has pain at lumbar area when he sits up from a supine position  Neurological: He is alert and oriented to person, place, and time. No cranial nerve deficit. Coordination normal.  Gait normal DTR symmetric bilaterally at knee jerk ankle jerk and biceps toes downward going bilaterally  Skin: Skin is warm and dry. No rash noted.  Psychiatric: He has a normal mood and affect.  Nursing note and vitals reviewed.   ED Course  Procedures (including critical care time) Labs Review Labs Reviewed - No data to display  Imaging Review No results found.   EKG Interpretation None      MDM  Imaging not indicated. No red flags for back pain. Plan prescription ibuprofen referral back to primary care physician Final diagnoses:  None   diagnosis chronic back pain with lumbar radiculopathy    Doug Sou, MD 09/02/14 [phone]

## 2015-07-02 ENCOUNTER — Other Ambulatory Visit: Payer: Self-pay | Admitting: Adult Health

## 2015-07-02 DIAGNOSIS — M545 Low back pain: Secondary | ICD-10-CM

## 2015-07-09 ENCOUNTER — Ambulatory Visit
Admission: RE | Admit: 2015-07-09 | Discharge: 2015-07-09 | Disposition: A | Discharge status: Home / Self Care | Payer: Medicaid Other | Source: Ambulatory Visit | Attending: Adult Health | Admitting: Adult Health

## 2015-07-09 DIAGNOSIS — M545 Low back pain: Secondary | ICD-10-CM

## 2015-11-10 IMAGING — CR DG LUMBAR SPINE COMPLETE 4+V
5 series · 5 of 5 positions shown · non-contrast
Comparison: Prior intraoperative radiographs of the lumbar spine
02/19/2014; Prior MRI lumbar spine 01/11/2014

CLINICAL DATA: 48-year-old male status post fall from chair with
lumbar spine pain

EXAM:
LUMBAR SPINE - COMPLETE 4+ VIEW

[t lumbar spine ap]
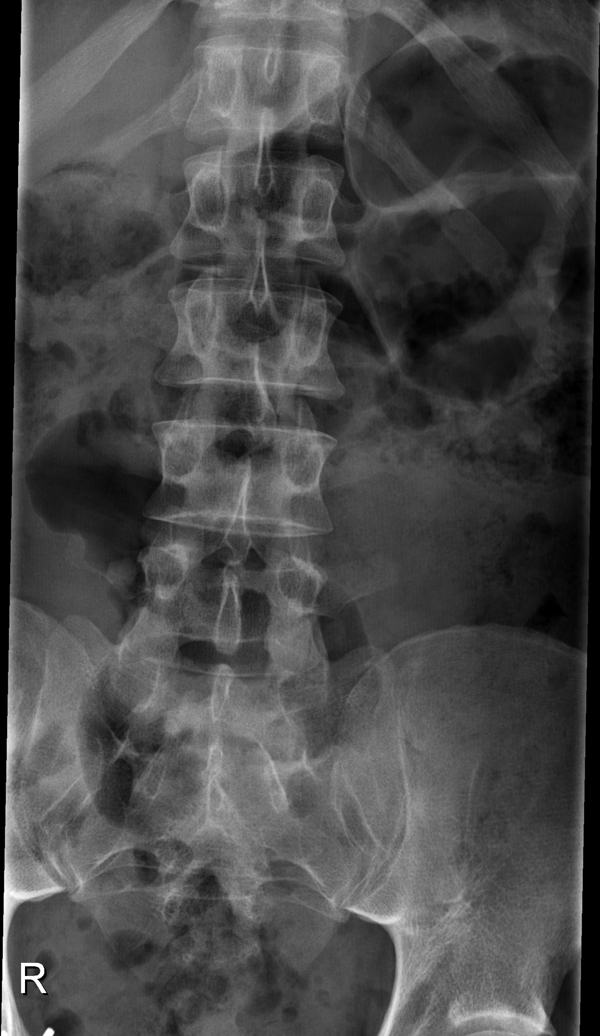

[t lumbar spine obl (1 of 2)]
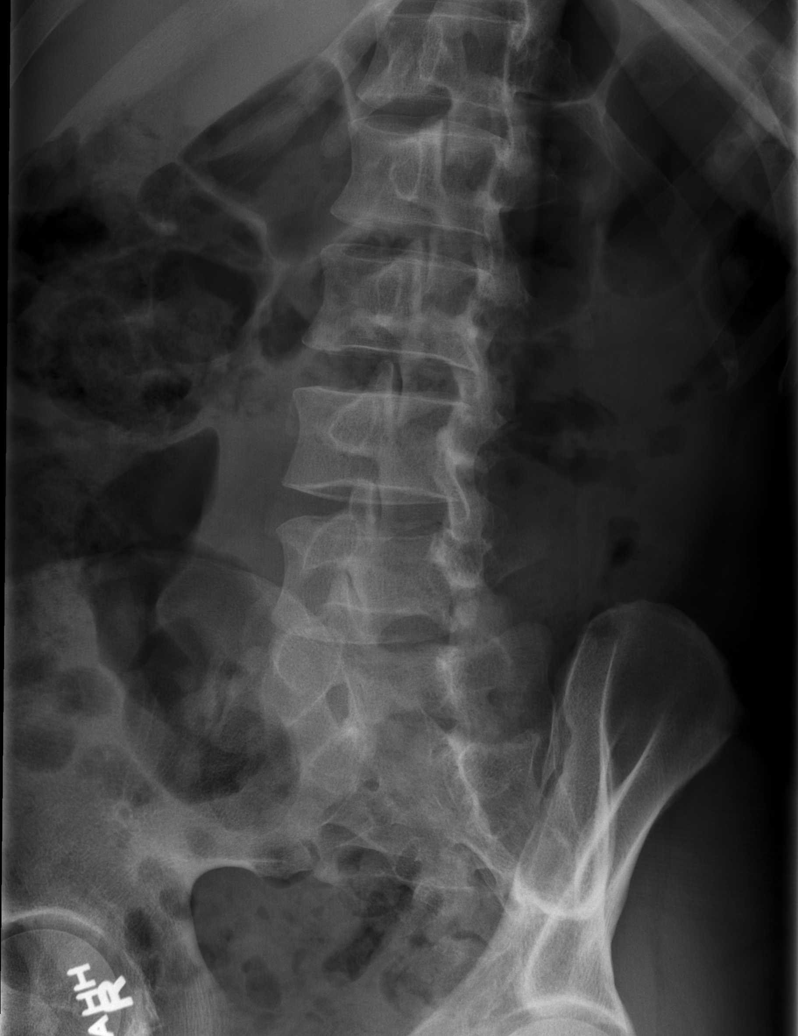

[t lumbar spine obl (2 of 2)]
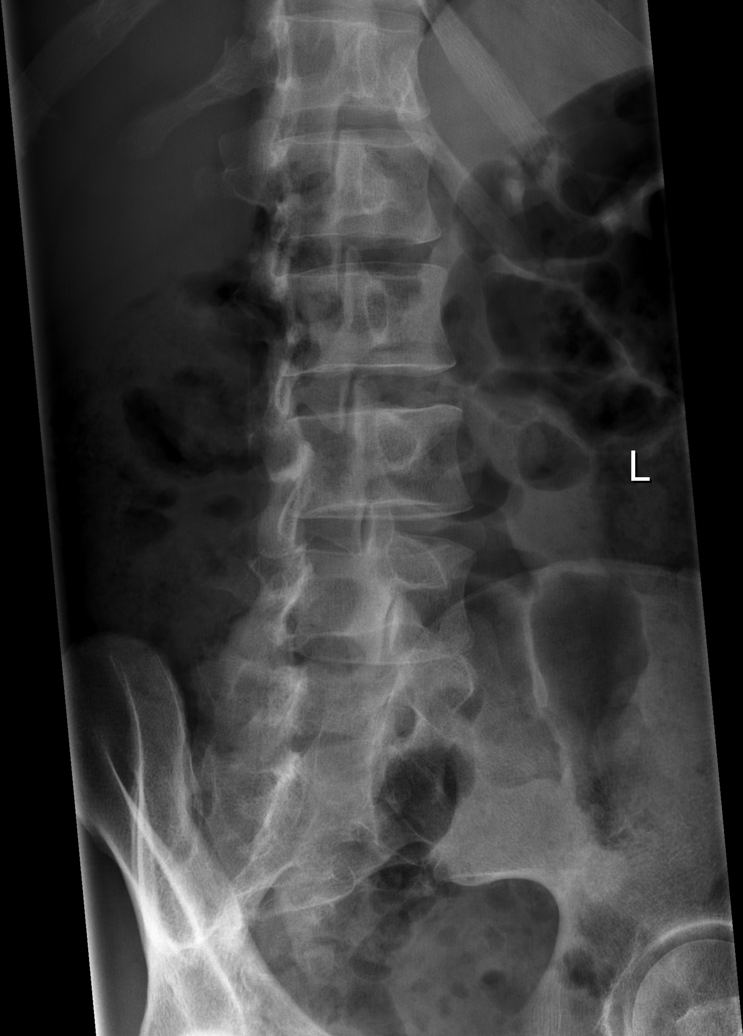

[t lumbar spine lat]
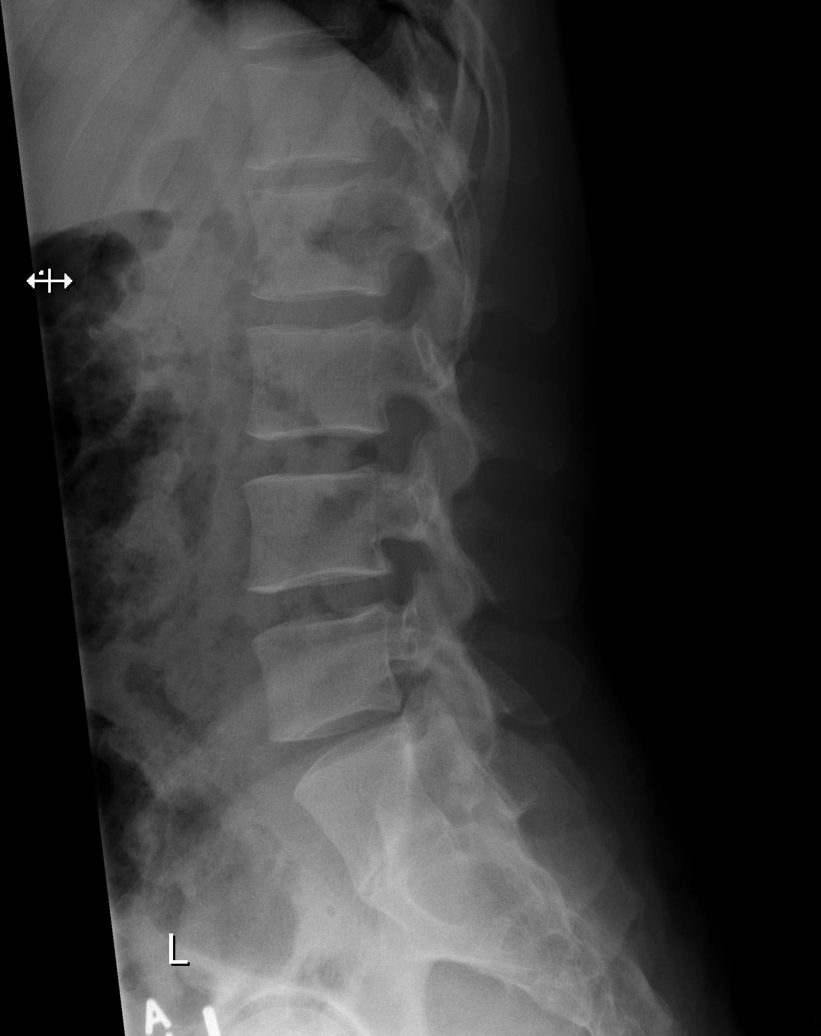

[t lumbar l-5 s-1 spot]
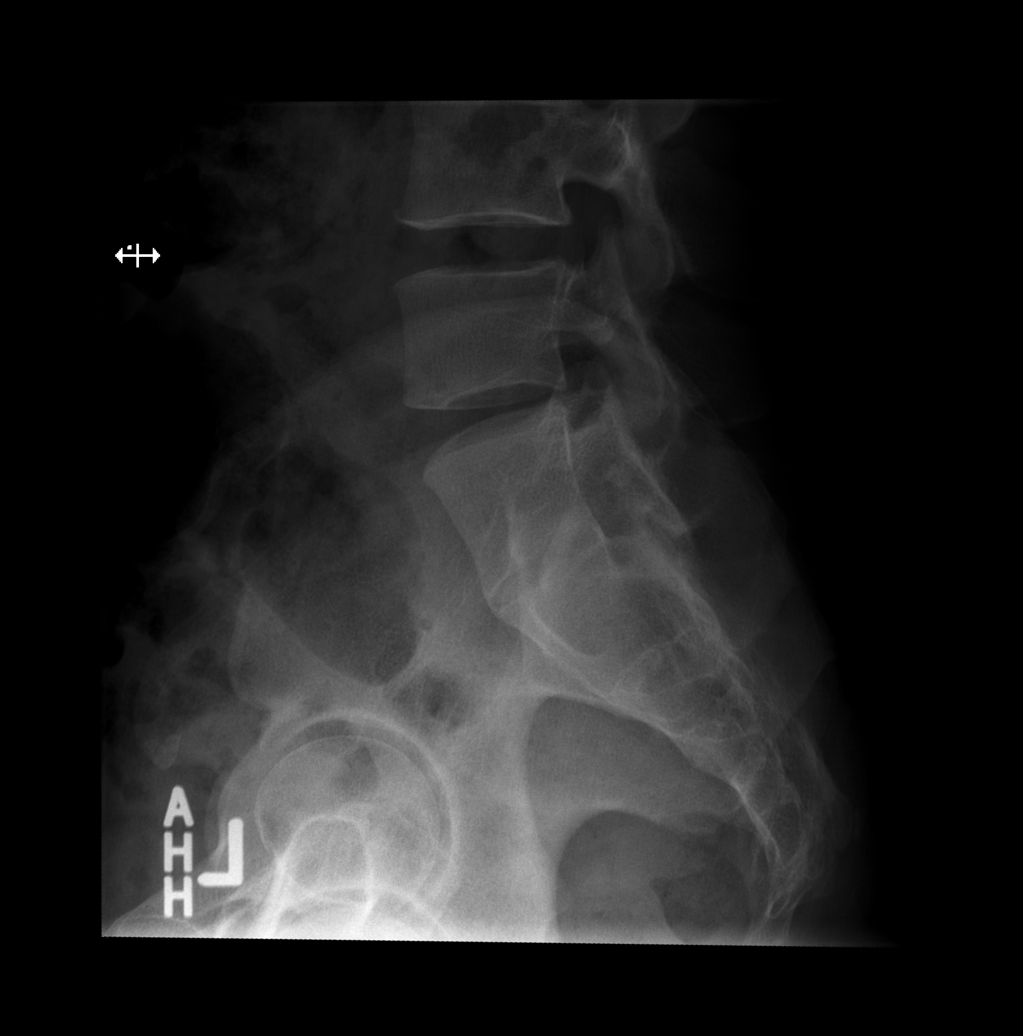

[5 of 5 positions shown; findings below may reference images not displayed]

FINDINGS: No evidence of acute fracture or malalignment. Mild disc space
narrowing at L4-L5 again noted without interval progression.
Vertebral body heights are maintained. No lytic or blastic osseous
lesion. Unremarkable visualized bowel gas pattern.
IMPRESSION: Negative.

## 2015-11-17 ENCOUNTER — Ambulatory Visit: Payer: Medicaid Other | Attending: Orthopaedic Surgery | Admitting: Physical Therapy

## 2015-11-17 ENCOUNTER — Encounter: Payer: Self-pay | Admitting: Physical Therapy

## 2015-11-17 DIAGNOSIS — R262 Difficulty in walking, not elsewhere classified: Secondary | ICD-10-CM | POA: Diagnosis present

## 2015-11-17 DIAGNOSIS — M79605 Pain in left leg: Secondary | ICD-10-CM | POA: Diagnosis present

## 2015-11-17 DIAGNOSIS — M545 Low back pain: Secondary | ICD-10-CM | POA: Insufficient documentation

## 2015-11-17 NOTE — Therapy (Signed)
[facility] [address] [place], [place], [address] Phone: [phone]   Fax:  [phone]  Physical Therapy Evaluation  Patient Details  Name: Bruce DarterJames Bates MRN: [id number] Date of Birth: 12/09/1965 Referring Provider: Eldred MangesMark C Yates, MD  Encounter Date: 11/17/2015      PT End of Session - 11/17/15 0810    Visit Number 1   Authorization Type medicaid- 1 time evaluation   PT Start Time 0803   PT Stop Time 0844   PT Time Calculation (min) 41 min   Activity Tolerance Patient tolerated treatment well   Behavior [facility] Therapy [facility] for tasks assessed/performed      Past Medical History:  Diagnosis Date  . Arthritis    right shoulder  . Medical history non-contributory    has never had any surgeries    Past Surgical History:  Procedure Laterality Date  . EYE SURGERY     left eye  -- "white dots in his eye"  . LASIK  2006    unsure of which eye, likely L eye   . LUMBAR LAMINECTOMY/DECOMPRESSION MICRODISCECTOMY Left 02/19/2014   Procedure: Left Lumbar four-five microdiskectomy;  Surgeon: Lisbeth RenshawNeelesh Nundkumar, MD;  Location: [facility];  Service: Neurosurgery;  Laterality: Left;  Left Lumbar four-five microdiskectomy    There were no vitals filed for this visit.       Subjective Assessment - 11/17/15 0805    Subjective Pt reports burning that runs down L leg and groin. When using restroom and bending down.    How long can you stand comfortably? unable   How long can you walk comfortably? unable   Patient Stated Goals decrease pain   Currently in Pain? Yes   Pain Score 7    Pain Location --  L SIJ   Pain Orientation Left   Pain Descriptors / Indicators Burning   Aggravating Factors  sitting   Pain Relieving Factors mild help from medications            [facility] - 11/17/15 0001      Assessment   Medical Diagnosis LBP   Referring Provider Eldred MangesMark C Yates, MD   Hand Dominance Right   Next MD Visit 3  weeks   Prior Therapy no     Precautions   Precautions None     Restrictions   Weight Bearing Restrictions No     Balance Screen   Has the patient fallen in the past 6 months Yes   How many times? 1  leg gave out when getting off couch     Home Environment   Living Environment Private residence   Living Arrangements Alone     Prior Function   Level of Independence Independent     Cognition   Overall Cognitive Status Within Functional Limits for tasks assessed     Posture/Postural Control   Posture Comments kyphotic, weight shifted to RLE     Palpation   Palpation comment TTP L SIJ and ITB                   [facility] Adult PT Treatment/Exercise - 11/17/15 0001      Exercises   Exercises Knee/Hip     Knee/Hip Exercises: Standing   Other Standing Knee Exercises rows & lat pulls green tband     Knee/Hip Exercises: Supine   Bridges with Clamshell 20 reps   Other Supine Knee/Hip Exercises supine iso abd/add     Manual Therapy  Manual Therapy Muscle Energy Technique;Joint mobilization   Joint Mobilization prone, L SIJ; long axis gr 4 L hip   Muscle Energy Technique L post/R ant pelvic rotation                PT Education - 11/17/15 0902    Education provided Yes   Education Details anatomy of condition, POC, HEP, exercise form/rationale   Person(s) Educated Patient   Methods Explanation;Demonstration;Tactile cues;Verbal cues;Handout   Comprehension Verbalized understanding;Returned demonstration;Verbal cues required;Tactile cues required                    Plan - 11/17/15 0902    Clinical Impression Statement Pt presents to PT with complaints of L leg and groin pain that is highest when seated on the toilet. Pain noted to be caused by L SIJ and L ITB tightness and pain was notably decreased following treatment. We discussed that this pain is temporary and if he does exercises he will feel better. pt was under the impression that he was  going to have surgery to remove multiple spinal levels and he would not get better. I provided him with a handout for exercises and for the [facility] clinic and encouraged hime to continue exercises. Pt was instructed to contact us with any further questions or needs.    PT Treatment/Interventions ADLs/Self Care Home Management;Therapeutic exercise;Patient/family education;Passive range of motion;Manual techniques   Consulted and Agree with Plan of Care Patient      Patient will benefit from skilled therapeutic intervention in order to improve the following deficits and impairments:  Pain, Improper body mechanics, Postural dysfunction  Visit Diagnosis: Left low back pain, unspecified chronicity, with sciatica presence unspecified - Plan: PT plan of care cert/re-cert  Pain in left leg - Plan: PT plan of care cert/re-cert  Difficulty in walking, not elsewhere classified - Plan: PT plan of care cert/re-cert     Problem List Patient Active Problem List   Diagnosis Date Noted  . HNP (herniated nucleus pulposus), lumbar 02/19/2014  . Herniated lumbar intervertebral disc 01/11/2014  . Low serum vitamin B12 01/09/2014  . Chronic radicular pain of lower back 01/08/2014  . Loss of weight 01/08/2014    Kristoph Sattler C. Matha Masse PT, DPT 11/17/15 9:13 AM   [facility] [address] [place], [place], [address] Phone: [phone]   Fax:  [phone]  Name: Bruce Bates MRN: [id number] Date of Birth: 09-12-1965

## 2015-11-25 ENCOUNTER — Ambulatory Visit (INDEPENDENT_AMBULATORY_CARE_PROVIDER_SITE_OTHER): Payer: Self-pay | Admitting: Orthopaedic Surgery

## 2016-01-18 ENCOUNTER — Emergency Department (HOSPITAL_COMMUNITY)
Admission: EM | Admit: 2016-01-18 | Discharge: 2016-01-18 | Disposition: A | Discharge status: Home / Self Care | Payer: Medicaid Other | Attending: Emergency Medicine | Admitting: Emergency Medicine

## 2016-01-18 ENCOUNTER — Encounter (HOSPITAL_COMMUNITY): Payer: Self-pay

## 2016-01-18 DIAGNOSIS — F17210 Nicotine dependence, cigarettes, uncomplicated: Secondary | ICD-10-CM | POA: Insufficient documentation

## 2016-01-18 DIAGNOSIS — G8929 Other chronic pain: Secondary | ICD-10-CM

## 2016-01-18 DIAGNOSIS — M545 Low back pain: Secondary | ICD-10-CM | POA: Diagnosis not present

## 2016-01-18 DIAGNOSIS — M549 Dorsalgia, unspecified: Secondary | ICD-10-CM

## 2016-01-18 DIAGNOSIS — Z79899 Other long term (current) drug therapy: Secondary | ICD-10-CM | POA: Diagnosis not present

## 2016-01-18 DIAGNOSIS — M5416 Radiculopathy, lumbar region: Secondary | ICD-10-CM

## 2016-01-18 MED ORDER — NAPROXEN 500 MG PO TABS: 500.0000 mg | ORAL_TABLET | Freq: Two times a day (BID) | ORAL | 0 refills | Status: AC

## 2016-01-18 MED ORDER — CYCLOBENZAPRINE HCL 10 MG PO TABS: 10.0000 mg | ORAL_TABLET | Freq: Two times a day (BID) | ORAL | 0 refills | Status: AC | PRN

## 2016-01-18 NOTE — ED Triage Notes (Signed)
Pt presents with c/o lumbar back pain. Pt reports a hx of back problems and also reports a fall that occurred last Tuesday. Pt is ambulatory to triage.

## 2016-01-18 NOTE — ED Provider Notes (Signed)
[facility]-EMERGENCY DEPT Provider Note   CSN: [id number] Arrival date & time: 01/18/16  1202  By signing my name below, I, Bruce Bates, attest that this documentation has been prepared under the direction and in the presence of Fayrene HelperBowie Jesika Men, PA-C . Electronically Signed: Linna Darnerussell Bates, Scribe. 01/18/2016. 12:20 PM.  History   Chief Complaint Chief Complaint  Patient presents with  . Back Pain    The history is provided by the patient. No language interpreter was used.     HPI Comments: Bruce Bates is a 50 y.o. male with PMHx significant for chronic lower back who presents to the Emergency Department complaining of an exacerbation of his chronic lower back pain beginning 5 days ago. Pt states he had lumbar disc surgery in February of this year while his PSHx indicates the surgery was performed in January of 2016. Chart shows pt began taking prescription Percocet in February of 2016 and he states he has been out of this medication for several months. He reports his back pain was controlled with Percocet. He states his back pain is worse when he is straining to have a bowel movement and states the pain radiates into his groin during bowel movements. He states he is ambulatory with difficulty due to his back pain. He has been laying down at home with no relief of his back pain and has not tried any OTC medications. He notes his most recent back x-ray revealed two degenerative discs and he states his PCP advised surgery. Pt has discussed pain management with his PCP but is not followed by a pain management clinic at this time. He denies h/o IV drug use, immunocompromised conditions, and kidney stones. He further denies fever, chills, hematuria, bowel/bladder incontinence, numbness, saddle anesthesia/cauda equina, weakness, or any other associated symptoms.  Neurosurgeon: Dr. Conchita ParisNundkumar PCP: Yevonne PaxAlison Ervin  Past Medical History:  Diagnosis Date  . Arthritis    right shoulder  . Medical history  non-contributory    has never had any surgeries    Patient Active Problem List   Diagnosis Date Noted  . HNP (herniated nucleus pulposus), lumbar 02/19/2014  . Herniated lumbar intervertebral disc 01/11/2014  . Low serum vitamin B12 01/09/2014  . Chronic radicular pain of lower back 01/08/2014  . Loss of weight 01/08/2014    Past Surgical History:  Procedure Laterality Date  . EYE SURGERY     left eye  -- "white dots in his eye"  . LASIK  2006    unsure of which eye, likely L eye   . LUMBAR LAMINECTOMY/DECOMPRESSION MICRODISCECTOMY Left 02/19/2014   Procedure: Left Lumbar four-five microdiskectomy;  Surgeon: Lisbeth RenshawNeelesh Nundkumar, MD;  Location: [facility];  Service: Neurosurgery;  Laterality: Left;  Left Lumbar four-five microdiskectomy       Home Medications    Prior to Admission medications   Medication Sig Start Date End Date Taking? Authorizing Provider  acetaminophen (TYLENOL) 325 MG tablet Take 650 mg by mouth every 6 (six) hours as needed for moderate pain.    Historical Provider, MD  cyanocobalamin 500 MCG tablet Take 2 tablets (1,000 mcg total) by mouth daily. 01/24/14   Quentin Angstlugbemiga E Jegede, MD  cyclobenzaprine (FLEXERIL) 10 MG tablet Take 1 tablet (10 mg total) by mouth 3 times/day as needed-between meals & bedtime for muscle spasms. Patient not taking: Reported on 08/20/2014 01/08/14   Dessa PhiJosalyn Funches, MD  diazepam (VALIUM) 5 MG tablet Take 1 tablet (5 mg total) by mouth every 6 (six) hours as needed for anxiety.  Patient not taking: Reported on 08/20/2014 02/19/14   Lisbeth RenshawNeelesh Nundkumar, MD  diazepam (VALIUM) 5 MG tablet Take 1 tablet (5 mg total) by mouth every 8 (eight) hours as needed for muscle spasms. 08/20/14   Elwin MochaBlair Walden, MD  ibuprofen (ADVIL,MOTRIN) 800 MG tablet Take 1 tablet (800 mg total) by mouth 3 (three) times daily. 09/02/14   Doug SouSam Jacubowitz, MD  naproxen (NAPROSYN) 500 MG tablet Take 1 tablet (500 mg total) by mouth 2 (two) times daily. Patient not taking:  Reported on 08/20/2014 08/02/14   Teressa LowerVrinda Pickering, NP  oxyCODONE-acetaminophen (PERCOCET/ROXICET) 5-325 MG per tablet Take 2 tablets by mouth every 4 (four) hours as needed for severe pain. Patient not taking: Reported on 08/20/2014 03/31/14   Zadie Rhineonald Wickline, MD  oxyCODONE-acetaminophen (PERCOCET/ROXICET) 5-325 MG per tablet Take 1 tablet by mouth every 6 (six) hours as needed for severe pain. 08/20/14   Elwin MochaBlair Walden, MD    Family History Family History  Problem Relation Age of Onset  . Cancer Mother     breast   . Kidney disease Mother   . Diabetes Brother   . Heart disease Neg Hx   . Hypertension Neg Hx     Social History Social History  Substance Use Topics  . Smoking status: Current Some Day Smoker    Packs/day: 0.50    Years: 25.00    Types: Cigarettes  . Smokeless tobacco: Former NeurosurgeonUser  . Alcohol use No     Allergies   Gabapentin   Review of Systems Review of Systems  Constitutional: Negative for chills and fever.  Gastrointestinal:       Negative for bowel incontinence.  Genitourinary: Negative for hematuria.       Negative for bladder incontinence.  Musculoskeletal: Positive for back pain (lower, chronic).  Neurological: Negative for weakness and numbness.       Negative for saddle anesthesia/cauda equina.     Physical Exam Updated Vital Signs BP 135/88   Pulse 73   Temp 98.2 F (36.8 C) (Oral)   Resp 18   Ht 6' (1.829 m)   Wt 165 lb (74.8 kg)   SpO2 99%   BMI 22.38 kg/m   Physical Exam  Constitutional: He is oriented to person, place, and time. He appears well-developed and well-nourished. No distress.  HENT:  Head: Normocephalic and atraumatic.  Eyes: Conjunctivae and EOM are normal.  Neck: Neck supple. No tracheal deviation present.  Cardiovascular: Normal rate.   Pulmonary/Chest: Effort normal. No respiratory distress.  Musculoskeletal: Normal range of motion. He exhibits tenderness.  Tenderness along the lumbar spine without crepitus, step  off, or overlying skin changes. Well-healing surgical scar at L-5 and S-1 without signs of hernia or infection. Pedal pulses intact.  Neurological: He is alert and oriented to person, place, and time.  1+ patellar DTR bilaterally. No foot drops. Equal strength to bilateral lower extremities.  Skin: Skin is warm and dry.  Psychiatric: He has a normal mood and affect. His behavior is normal.  Nursing note and vitals reviewed.    ED Treatments / Results  Labs (all labs ordered are listed, but only abnormal results are displayed) Labs Reviewed - No data to display  EKG  EKG Interpretation None       Radiology No results found.  Procedures Procedures (including critical care time)  DIAGNOSTIC STUDIES: Oxygen Saturation is 99% on RA, normal by my interpretation.    COORDINATION OF CARE: 12:28 PM Discussed treatment plan with pt at bedside and pt  agreed to plan.  Medications Ordered in ED Medications - No data to display   Initial Impression / Assessment and Plan / ED Course  I have reviewed the triage vital signs and the nursing notes.  Pertinent labs & imaging results that were available during my care of the patient were reviewed by me and considered in my medical decision making (see chart for details).  Clinical Course     BP 135/88   Pulse 64   Temp 98.2 F (36.8 C) (Oral)   Resp 18   Ht 6' (1.829 m)   Wt 74.8 kg   SpO2 98%   BMI 22.38 kg/m    Final Clinical Impressions(s) / ED Diagnoses  Patient with back pain.  No neurological deficits and normal neuro exam.  Patient is ambulatory.  No loss of bowel or bladder control.  No concern for cauda equina.  No fever, night sweats, weight loss, h/o cancer, IVDA, no recent procedure to back. No urinary symptoms suggestive of UTI.  Supportive care and return precaution discussed. Appears safe for discharge at this time. Follow up as indicated in discharge paperwork.  Final diagnoses:  None    New  Prescriptions Discharge Medication List as of 01/18/2016 12:28 PM     I personally performed the services described in this documentation, which was scribed in my presence. The recorded information has been reviewed and is accurate.      Fayrene Helper, PA-C 01/18/16 1255    Shaune Pollack, MD 01/19/16 [phone]

## 2016-04-21 ENCOUNTER — Ambulatory Visit: Payer: Medicaid Other | Admitting: Urology

## 2016-06-16 ENCOUNTER — Encounter: Payer: Self-pay | Admitting: Family Medicine
